# Patient Record
Sex: Female | Born: 1955 | Race: White | Hispanic: No | Smoking: Former smoker
Health system: Southern US, Community
[De-identification: ages and names within clinical notes are randomized; demographics above are authoritative.]

## PROBLEM LIST (undated history)

## (undated) DIAGNOSIS — H469 Unspecified optic neuritis: Secondary | ICD-10-CM

## (undated) DIAGNOSIS — E119 Type 2 diabetes mellitus without complications: Secondary | ICD-10-CM

## (undated) DIAGNOSIS — J45909 Unspecified asthma, uncomplicated: Secondary | ICD-10-CM

## (undated) DIAGNOSIS — F329 Major depressive disorder, single episode, unspecified: Secondary | ICD-10-CM

## (undated) DIAGNOSIS — C439 Malignant melanoma of skin, unspecified: Secondary | ICD-10-CM

## (undated) DIAGNOSIS — E669 Obesity, unspecified: Secondary | ICD-10-CM

## (undated) DIAGNOSIS — E7800 Pure hypercholesterolemia, unspecified: Secondary | ICD-10-CM

## (undated) DIAGNOSIS — F32A Depression, unspecified: Secondary | ICD-10-CM

## (undated) DIAGNOSIS — F419 Anxiety disorder, unspecified: Secondary | ICD-10-CM

## (undated) DIAGNOSIS — I10 Essential (primary) hypertension: Secondary | ICD-10-CM

## (undated) HISTORY — PX: ABDOMINAL HYSTERECTOMY: SHX81

## (undated) HISTORY — PX: JOINT REPLACEMENT: SHX530

---

## 1998-06-19 ENCOUNTER — Ambulatory Visit (HOSPITAL_COMMUNITY): Admission: RE | Admit: 1998-06-19 | Discharge: 1998-06-19 | Payer: Self-pay | Admitting: *Deleted

## 1998-06-19 ENCOUNTER — Encounter: Payer: Self-pay | Admitting: Internal Medicine

## 2008-06-19 ENCOUNTER — Encounter: Admission: RE | Admit: 2008-06-19 | Discharge: 2008-06-19 | Payer: Self-pay | Admitting: Family Medicine

## 2008-07-10 ENCOUNTER — Encounter: Admission: RE | Admit: 2008-07-10 | Discharge: 2008-07-10 | Payer: Self-pay | Admitting: Gastroenterology

## 2008-10-06 ENCOUNTER — Inpatient Hospital Stay (HOSPITAL_COMMUNITY): Admission: RE | Admit: 2008-10-06 | Discharge: 2008-10-07 | Payer: Self-pay | Admitting: Orthopedic Surgery

## 2008-10-28 ENCOUNTER — Inpatient Hospital Stay (HOSPITAL_COMMUNITY): Admission: RE | Admit: 2008-10-28 | Discharge: 2008-10-30 | Payer: Self-pay | Admitting: Orthopedic Surgery

## 2008-11-03 DIAGNOSIS — E119 Type 2 diabetes mellitus without complications: Secondary | ICD-10-CM | POA: Insufficient documentation

## 2008-11-20 ENCOUNTER — Ambulatory Visit (HOSPITAL_COMMUNITY): Admission: RE | Admit: 2008-11-20 | Discharge: 2008-11-20 | Payer: Self-pay | Admitting: Gastroenterology

## 2008-11-20 ENCOUNTER — Encounter (INDEPENDENT_AMBULATORY_CARE_PROVIDER_SITE_OTHER): Payer: Self-pay | Admitting: Gastroenterology

## 2008-11-26 ENCOUNTER — Encounter: Admission: RE | Admit: 2008-11-26 | Discharge: 2008-11-26 | Payer: Self-pay | Admitting: Family Medicine

## 2010-02-01 ENCOUNTER — Encounter: Payer: Self-pay | Admitting: Family Medicine

## 2010-04-15 LAB — BASIC METABOLIC PANEL
BUN: 15 mg/dL (ref 6–23)
BUN: 6 mg/dL (ref 6–23)
CO2: 24 mEq/L (ref 19–32)
CO2: 29 mEq/L (ref 19–32)
Calcium: 9 mg/dL (ref 8.4–10.5)
Chloride: 104 mEq/L (ref 96–112)
Chloride: 106 mEq/L (ref 96–112)
Creatinine, Ser: 0.71 mg/dL (ref 0.4–1.2)
Creatinine, Ser: 0.77 mg/dL (ref 0.4–1.2)
GFR calc Af Amer: >60 mL/min (ref >60)
Glucose, Bld: 117 mg/dL — ABNORMAL HIGH (ref 70–99)
Potassium: 4 mEq/L (ref 3.5–5.1)

## 2010-04-15 LAB — CBC
HCT: 36.1 % (ref 36.0–46.0)
MCHC: 34.4 g/dL (ref 30.0–36.0)
MCHC: 34.6 g/dL (ref 30.0–36.0)
MCV: 91.1 fL (ref 78.0–100.0)
MCV: 92.4 fL (ref 78.0–100.0)
Platelets: 267 10*3/uL (ref 150–400)
Platelets: 316 10*3/uL (ref 150–400)
RBC: 4.42 MIL/uL (ref 3.87–5.11)
RDW: 14 % (ref 11.5–15.5)
WBC: 7.1 10*3/uL (ref 4.0–10.5)
WBC: 7.9 10*3/uL (ref 4.0–10.5)

## 2010-04-15 LAB — DIFFERENTIAL
Basophils Absolute: 0.1 10*3/uL (ref 0.0–0.1)
Basophils Relative: 1 % (ref 0–1)
Eosinophils Absolute: 0.3 10*3/uL (ref 0.0–0.7)
Monocytes Relative: 9 % (ref 3–12)
Neutro Abs: 3.6 10*3/uL (ref 1.7–7.7)
Neutrophils Relative %: 50 % (ref 43–77)

## 2010-04-15 LAB — URINALYSIS, ROUTINE W REFLEX MICROSCOPIC
Protein, ur: NEGATIVE mg/dL
Urobilinogen, UA: 1 mg/dL (ref 0.0–1.0)

## 2010-04-15 LAB — APTT: aPTT: 26 seconds (ref 24–37)

## 2010-04-15 LAB — PROTIME-INR
INR: 1.03 (ref 0.00–1.49)
Prothrombin Time: 13.4 seconds (ref 11.6–15.2)

## 2010-04-16 LAB — URINALYSIS, ROUTINE W REFLEX MICROSCOPIC
Bilirubin Urine: NEGATIVE
Hgb urine dipstick: NEGATIVE
Protein, ur: NEGATIVE mg/dL
Urobilinogen, UA: 0.2 mg/dL (ref 0.0–1.0)

## 2010-04-16 LAB — BASIC METABOLIC PANEL
BUN: 11 mg/dL (ref 6–23)
CO2: 27 mEq/L (ref 19–32)
Calcium: 9.6 mg/dL (ref 8.4–10.5)
Chloride: 105 mEq/L (ref 96–112)
Chloride: 108 mEq/L (ref 96–112)
Creatinine, Ser: 0.8 mg/dL (ref 0.4–1.2)
GFR calc Af Amer: >60 mL/min (ref >60)
GFR calc Af Amer: >60 mL/min (ref >60)
Potassium: 4 mEq/L (ref 3.5–5.1)
Sodium: 140 mEq/L (ref 135–145)

## 2010-04-16 LAB — PROTIME-INR
INR: 1 (ref 0.00–1.49)
Prothrombin Time: 13 seconds (ref 11.6–15.2)

## 2010-04-16 LAB — APTT: aPTT: 25 seconds (ref 24–37)

## 2010-04-16 LAB — DIFFERENTIAL
Basophils Relative: 1 % (ref 0–1)
Eosinophils Absolute: 0.3 10*3/uL (ref 0.0–0.7)
Lymphs Abs: 2.5 10*3/uL (ref 0.7–4.0)
Neutro Abs: 5.2 10*3/uL (ref 1.7–7.7)
Neutrophils Relative %: 60 % (ref 43–77)

## 2010-04-16 LAB — CBC
HCT: 35.7 % — ABNORMAL LOW (ref 36.0–46.0)
Hemoglobin: 12.5 g/dL (ref 12.0–15.0)
MCHC: 35.1 g/dL (ref 30.0–36.0)
MCV: 91.5 fL (ref 78.0–100.0)
MCV: 91.8 fL (ref 78.0–100.0)
Platelets: 267 10*3/uL (ref 150–400)
RBC: 3.89 MIL/uL (ref 3.87–5.11)
WBC: 8.6 10*3/uL (ref 4.0–10.5)

## 2010-10-04 ENCOUNTER — Emergency Department (HOSPITAL_COMMUNITY)
Admission: EM | Admit: 2010-10-04 | Discharge: 2010-10-04 | Disposition: A | Discharge status: Home / Self Care | Payer: BC Managed Care – PPO | Attending: Emergency Medicine | Admitting: Emergency Medicine

## 2010-10-04 DIAGNOSIS — R6883 Chills (without fever): Secondary | ICD-10-CM | POA: Insufficient documentation

## 2010-10-04 DIAGNOSIS — L0211 Cutaneous abscess of neck: Secondary | ICD-10-CM | POA: Insufficient documentation

## 2010-10-18 IMAGING — MG MM SCREEN MAMMOGRAM BILATERAL
6 series · 6 of 6 positions shown · non-contrast
Comparison: none

DG SCREEN MAMMOGRAM BILATERAL
Bilateral CC and MLO view(s) were taken.

DIGITAL SCREENING MAMMOGRAM WITH CAD:
There are scattered fibroglandular densities.  No masses or malignant type calcifications are 
identified.

[R CC]
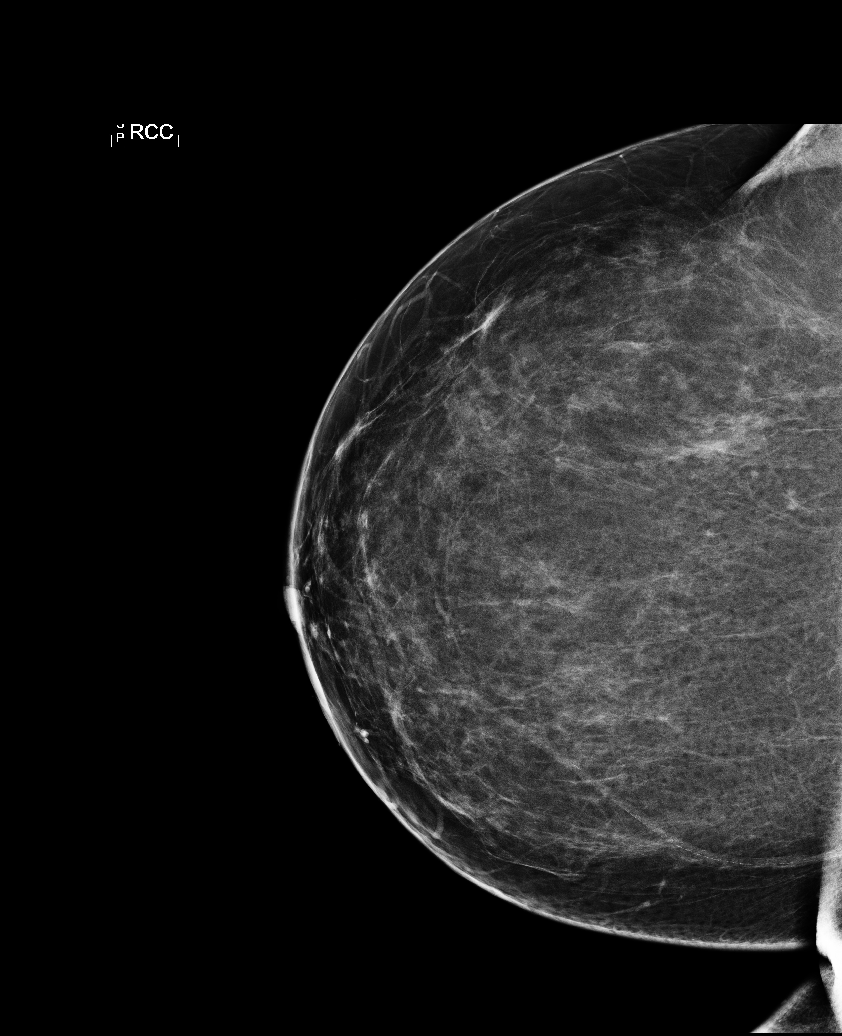

[L CC]
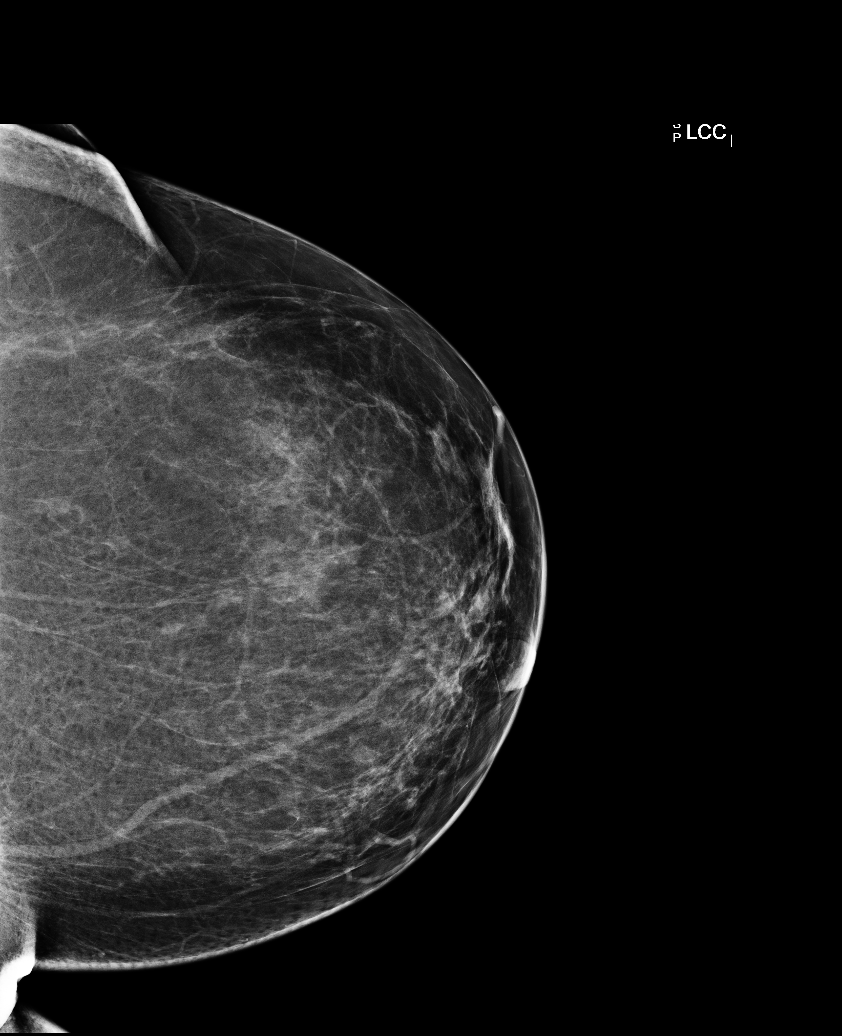

[L MLO]
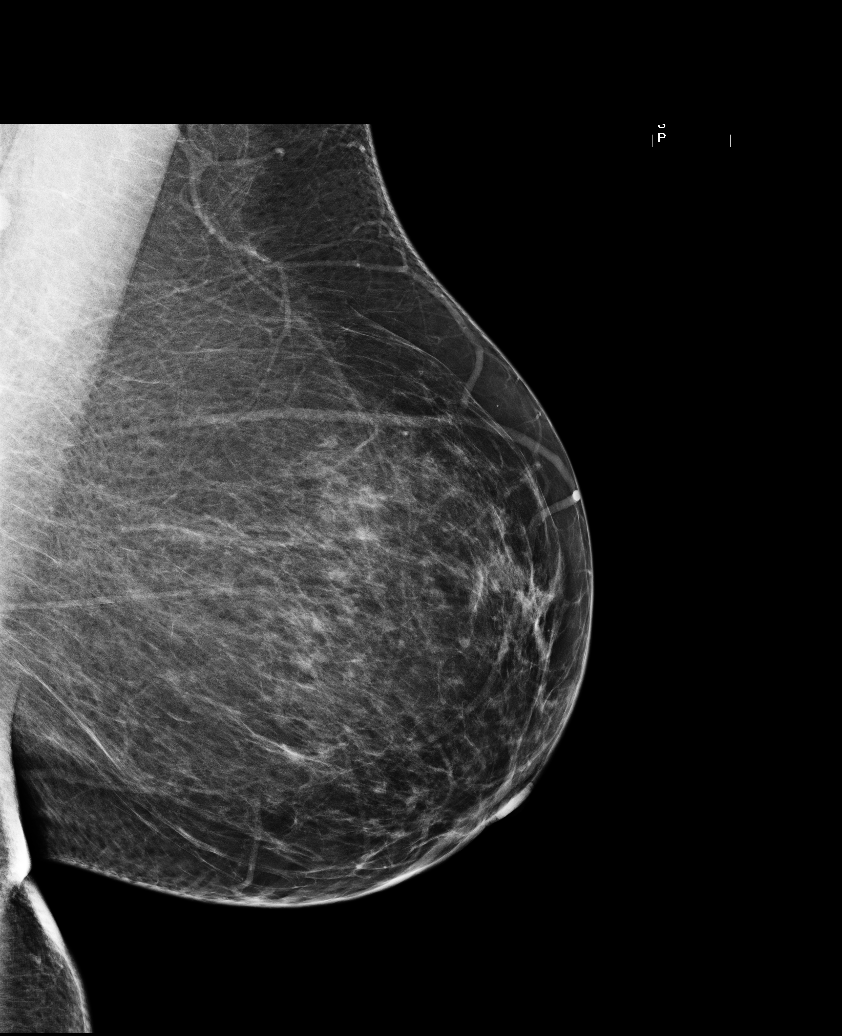

[R MLO]
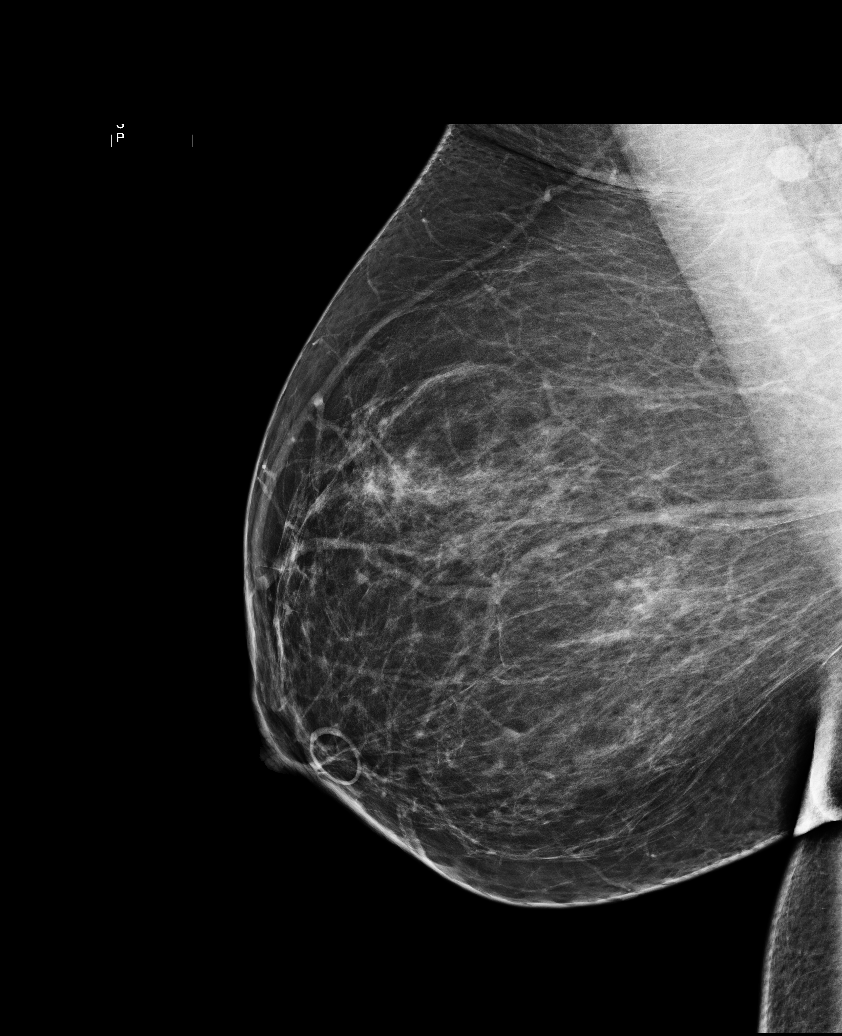

[L XCCL]
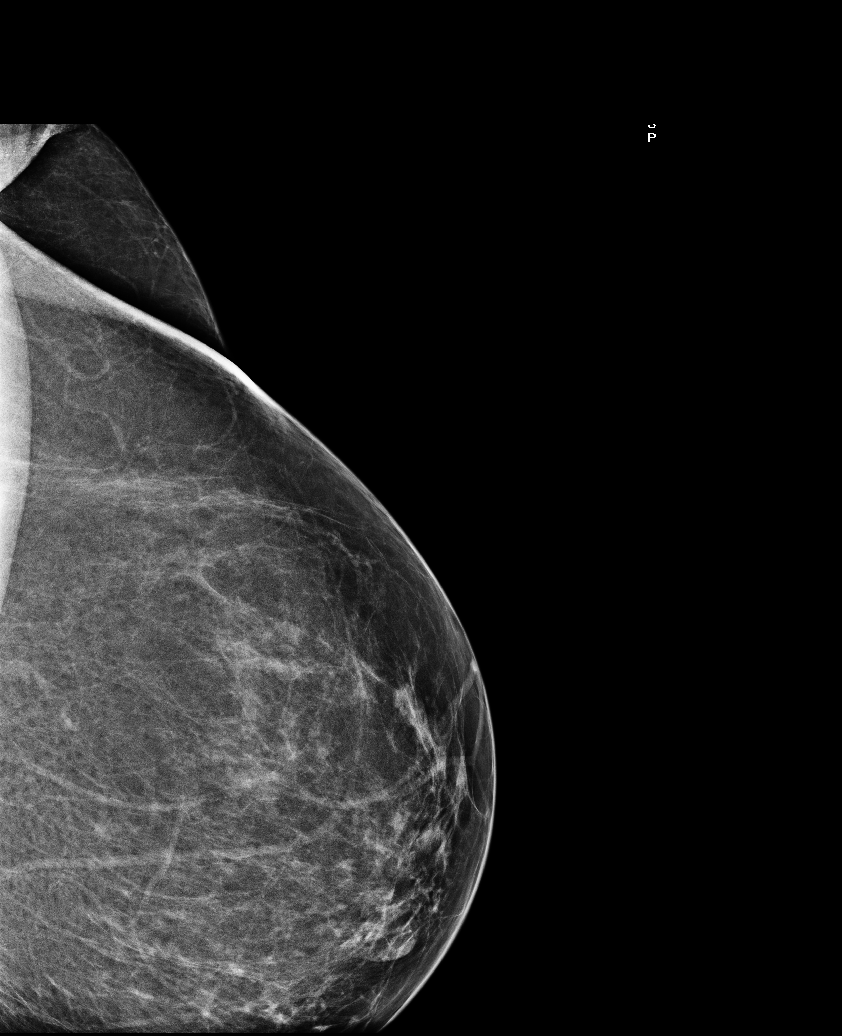

[R XCCL]
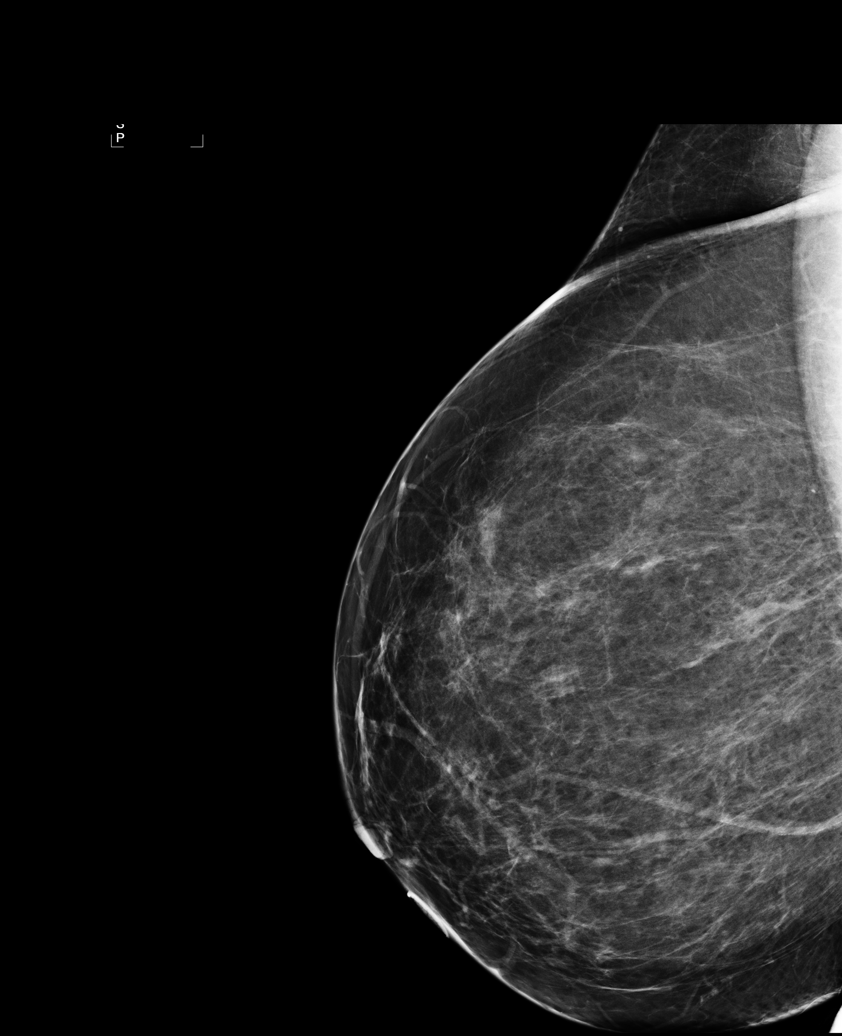

[6 of 6 positions shown; findings below may reference images not displayed]

IMPRESSION: No specific mammographic evidence of malignancy.  Next screening mammogram is recommended in one 
year.

A result letter of this screening mammogram will be mailed directly to the patient.

ASSESSMENT: Negative - BI-RADS 1

Screening mammogram in 1 year.
ANALYZED BY COMPUTER AIDED DETECTION. , THIS PROCEDURE WAS A DIGITAL MAMMOGRAM.

## 2011-04-29 DIAGNOSIS — I10 Essential (primary) hypertension: Secondary | ICD-10-CM | POA: Insufficient documentation

## 2011-04-29 DIAGNOSIS — G4700 Insomnia, unspecified: Secondary | ICD-10-CM | POA: Insufficient documentation

## 2011-04-29 DIAGNOSIS — E1165 Type 2 diabetes mellitus with hyperglycemia: Secondary | ICD-10-CM | POA: Insufficient documentation

## 2011-04-30 DIAGNOSIS — R945 Abnormal results of liver function studies: Secondary | ICD-10-CM | POA: Insufficient documentation

## 2011-12-05 ENCOUNTER — Other Ambulatory Visit: Payer: Self-pay | Admitting: Family Medicine

## 2011-12-05 DIAGNOSIS — Z1231 Encounter for screening mammogram for malignant neoplasm of breast: Secondary | ICD-10-CM

## 2011-12-05 DIAGNOSIS — Z780 Asymptomatic menopausal state: Secondary | ICD-10-CM

## 2011-12-09 ENCOUNTER — Ambulatory Visit
Admission: RE | Admit: 2011-12-09 | Discharge: 2011-12-09 | Disposition: A | Discharge status: Home / Self Care | Payer: BC Managed Care – PPO | Source: Ambulatory Visit | Attending: Family Medicine | Admitting: Family Medicine

## 2011-12-09 DIAGNOSIS — Z780 Asymptomatic menopausal state: Secondary | ICD-10-CM

## 2011-12-09 DIAGNOSIS — Z1231 Encounter for screening mammogram for malignant neoplasm of breast: Secondary | ICD-10-CM

## 2013-03-24 ENCOUNTER — Other Ambulatory Visit: Payer: Self-pay | Admitting: Family Medicine

## 2014-08-16 ENCOUNTER — Encounter (HOSPITAL_COMMUNITY): Payer: Self-pay | Admitting: *Deleted

## 2014-08-16 ENCOUNTER — Inpatient Hospital Stay (HOSPITAL_COMMUNITY)
Admission: EM | Admit: 2014-08-16 | Discharge: 2014-08-18 | DRG: 123 | Disposition: A | Discharge status: Home / Self Care | Payer: BLUE CROSS/BLUE SHIELD | Attending: Family Medicine | Admitting: Family Medicine

## 2014-08-16 ENCOUNTER — Emergency Department (HOSPITAL_COMMUNITY): Payer: BLUE CROSS/BLUE SHIELD

## 2014-08-16 DIAGNOSIS — I10 Essential (primary) hypertension: Secondary | ICD-10-CM | POA: Diagnosis present

## 2014-08-16 DIAGNOSIS — Z820 Family history of epilepsy and other diseases of the nervous system: Secondary | ICD-10-CM | POA: Diagnosis not present

## 2014-08-16 DIAGNOSIS — H469 Unspecified optic neuritis: Principal | ICD-10-CM | POA: Diagnosis present

## 2014-08-16 DIAGNOSIS — E669 Obesity, unspecified: Secondary | ICD-10-CM | POA: Diagnosis present

## 2014-08-16 DIAGNOSIS — E119 Type 2 diabetes mellitus without complications: Secondary | ICD-10-CM

## 2014-08-16 DIAGNOSIS — E785 Hyperlipidemia, unspecified: Secondary | ICD-10-CM

## 2014-08-16 DIAGNOSIS — H5711 Ocular pain, right eye: Secondary | ICD-10-CM | POA: Diagnosis not present

## 2014-08-16 DIAGNOSIS — Z6838 Body mass index (BMI) 38.0-38.9, adult: Secondary | ICD-10-CM | POA: Diagnosis not present

## 2014-08-16 DIAGNOSIS — R9082 White matter disease, unspecified: Secondary | ICD-10-CM | POA: Diagnosis present

## 2014-08-16 DIAGNOSIS — H5710 Ocular pain, unspecified eye: Secondary | ICD-10-CM | POA: Insufficient documentation

## 2014-08-16 DIAGNOSIS — Z87891 Personal history of nicotine dependence: Secondary | ICD-10-CM | POA: Diagnosis not present

## 2014-08-16 HISTORY — DX: Essential (primary) hypertension: I10

## 2014-08-16 HISTORY — DX: Pure hypercholesterolemia, unspecified: E78.00

## 2014-08-16 HISTORY — DX: Type 2 diabetes mellitus without complications: E11.9

## 2014-08-16 HISTORY — DX: Obesity, unspecified: E66.9

## 2014-08-16 LAB — CBC WITH DIFFERENTIAL/PLATELET
BASOS PCT: 0 % (ref 0–1)
Basophils Absolute: 0 10*3/uL (ref 0.0–0.1)
EOS ABS: 0.3 10*3/uL (ref 0.0–0.7)
EOS PCT: 4 % (ref 0–5)
HEMATOCRIT: 38.4 % (ref 36.0–46.0)
Hemoglobin: 13.3 g/dL (ref 12.0–15.0)
LYMPHS ABS: 2.5 10*3/uL (ref 0.7–4.0)
LYMPHS PCT: 31 % (ref 12–46)
MCH: 30.3 pg (ref 26.0–34.0)
MCHC: 34.6 g/dL (ref 30.0–36.0)
MCV: 87.5 fL (ref 78.0–100.0)
Monocytes Absolute: 0.8 10*3/uL (ref 0.1–1.0)
Monocytes Relative: 10 % (ref 3–12)
NEUTROS ABS: 4.4 10*3/uL (ref 1.7–7.7)
Neutrophils Relative %: 55 % (ref 43–77)
Platelets: 261 10*3/uL (ref 150–400)
RBC: 4.39 MIL/uL (ref 3.87–5.11)
RDW: 13.5 % (ref 11.5–15.5)
WBC: 8 10*3/uL (ref 4.0–10.5)

## 2014-08-16 LAB — BASIC METABOLIC PANEL
Anion gap: 11 (ref 5–15)
BUN: 14 mg/dL (ref 6–20)
CALCIUM: 10.1 mg/dL (ref 8.9–10.3)
CHLORIDE: 101 mmol/L (ref 101–111)
CO2: 26 mmol/L (ref 22–32)
Creatinine, Ser: 0.81 mg/dL (ref 0.44–1.00)
GFR calc non Af Amer: >60 mL/min (ref >60)
Glucose, Bld: 142 mg/dL — ABNORMAL HIGH (ref 65–99)
Potassium: 3.6 mmol/L (ref 3.5–5.1)
Sodium: 138 mmol/L (ref 135–145)

## 2014-08-16 LAB — SEDIMENTATION RATE: Sed Rate: 44 mm/hr — ABNORMAL HIGH (ref 0–22)

## 2014-08-16 LAB — C-REACTIVE PROTEIN: CRP: <0.5 mg/dL (ref <1.0)

## 2014-08-16 LAB — GLUCOSE, CAPILLARY: Glucose-Capillary: 204 mg/dL — ABNORMAL HIGH (ref 65–99)

## 2014-08-16 MED ORDER — ONDANSETRON HCL 4 MG PO TABS: 4.0000 mg | ORAL_TABLET | Freq: Four times a day (QID) | ORAL | Status: DC | PRN

## 2014-08-16 MED ORDER — LOSARTAN POTASSIUM-HCTZ 100-25 MG PO TABS: 1.0000 | ORAL_TABLET | Freq: Every day | ORAL | Status: DC

## 2014-08-16 MED ORDER — SODIUM CHLORIDE 0.9 % IV SOLN
1000.0000 mg | Freq: Every day | INTRAVENOUS | Status: AC
  Administered 2014-08-17 – 2014-08-18 (×3): 1000 mg via INTRAVENOUS
  Filled 2014-08-16 (×3): qty 8

## 2014-08-16 MED ORDER — ATORVASTATIN CALCIUM 40 MG PO TABS
40.0000 mg | ORAL_TABLET | Freq: Every day | ORAL | Status: DC
  Administered 2014-08-16 – 2014-08-17 (×2): 40 mg via ORAL
  Filled 2014-08-16 (×2): qty 1

## 2014-08-16 MED ORDER — GADOBENATE DIMEGLUMINE 529 MG/ML IV SOLN: 20.0000 mL | Freq: Once | INTRAVENOUS | Status: DC | PRN

## 2014-08-16 MED ORDER — LORAZEPAM 2 MG/ML IJ SOLN
1.0000 mg | Freq: Once | INTRAMUSCULAR | Status: AC
  Administered 2014-08-16: 1 mg via INTRAVENOUS
  Filled 2014-08-16: qty 1

## 2014-08-16 MED ORDER — VITAMIN D 1000 UNITS PO TABS
2000.0000 [IU] | ORAL_TABLET | Freq: Every day | ORAL | Status: DC
  Administered 2014-08-17 – 2014-08-18 (×2): 2000 [IU] via ORAL
  Filled 2014-08-16 (×2): qty 2

## 2014-08-16 MED ORDER — ONDANSETRON HCL 4 MG/2ML IJ SOLN
4.0000 mg | Freq: Four times a day (QID) | INTRAMUSCULAR | Status: DC | PRN
Start: 2014-08-16 — End: 2014-08-18

## 2014-08-16 MED ORDER — MORPHINE SULFATE 2 MG/ML IJ SOLN: 2.0000 mg | INTRAMUSCULAR | Status: DC | PRN

## 2014-08-16 MED ORDER — FLUTICASONE PROPIONATE 50 MCG/ACT NA SUSP
1.0000 | Freq: Every day | NASAL | Status: DC | PRN
  Filled 2014-08-16: qty 16

## 2014-08-16 MED ORDER — ENOXAPARIN SODIUM 40 MG/0.4ML ~~LOC~~ SOLN
40.0000 mg | SUBCUTANEOUS | Status: DC
  Administered 2014-08-16 – 2014-08-17 (×2): 40 mg via SUBCUTANEOUS
  Filled 2014-08-16 (×2): qty 0.4

## 2014-08-16 MED ORDER — LOSARTAN POTASSIUM 50 MG PO TABS
100.0000 mg | ORAL_TABLET | Freq: Every day | ORAL | Status: DC
  Administered 2014-08-17 – 2014-08-18 (×2): 100 mg via ORAL
  Filled 2014-08-16 (×3): qty 2

## 2014-08-16 MED ORDER — FENTANYL CITRATE (PF) 100 MCG/2ML IJ SOLN
50.0000 ug | Freq: Once | INTRAMUSCULAR | Status: AC
  Administered 2014-08-16: 50 ug via INTRAVENOUS
  Filled 2014-08-16: qty 2

## 2014-08-16 MED ORDER — INSULIN ASPART 100 UNIT/ML ~~LOC~~ SOLN
0.0000 [IU] | Freq: Three times a day (TID) | SUBCUTANEOUS | Status: DC
  Administered 2014-08-17: 15 [IU] via SUBCUTANEOUS
  Administered 2014-08-17: 11 [IU] via SUBCUTANEOUS
  Administered 2014-08-17: 8 [IU] via SUBCUTANEOUS
  Administered 2014-08-18: 11 [IU] via SUBCUTANEOUS
  Administered 2014-08-18: 8 [IU] via SUBCUTANEOUS

## 2014-08-16 MED ORDER — GLIPIZIDE ER 10 MG PO TB24
10.0000 mg | ORAL_TABLET | Freq: Every day | ORAL | Status: DC
  Administered 2014-08-17 – 2014-08-18 (×2): 10 mg via ORAL
  Filled 2014-08-16 (×3): qty 1

## 2014-08-16 MED ORDER — HYDROCHLOROTHIAZIDE 25 MG PO TABS
25.0000 mg | ORAL_TABLET | Freq: Every day | ORAL | Status: DC
  Administered 2014-08-17 – 2014-08-18 (×2): 25 mg via ORAL
  Filled 2014-08-16 (×2): qty 1

## 2014-08-16 MED ORDER — HYDROCODONE-ACETAMINOPHEN 5-325 MG PO TABS
1.0000 | ORAL_TABLET | ORAL | Status: DC | PRN
  Administered 2014-08-16 – 2014-08-18 (×4): 2 via ORAL
  Filled 2014-08-16 (×4): qty 2

## 2014-08-16 NOTE — ED Notes (Signed)
Pt reports symptoms for over a week, having headache and blurred vision to right eye. Progressed to vision loss right eye, has been to eye dr and being treated for occular cellulitis but sent here today due to no relief from symptoms, was told she needs an MRI.

## 2014-08-16 NOTE — Consult Note (Signed)
Reason for Consult:Poor vision right eye Referring Physician: Regenia Skeeter  CC: Decreased vision and pain in the right eye  HPI: Mariah Torres is an 59 y.o. female who reports that her symptoms began about 2 weeks ago.  At that time she noted pain on the right side of her head behind the right eye.  This slowly progressed over the first few days to light sensitivity in the right eye.  She then noted blurry vision and swelling of the eyelid.  The patient was seen by her eye doctor and given antibiotics for a lid cellulitis. Her vision continued to deteriorate.  Her antibiotics were changed with no improvement in symptoms and the patient was referred here for further evaluation.    Past Medical History  Diagnosis Date  . Hypertension   . Diabetes mellitus without complication   . Obesity   . High cholesterol     History reviewed. No pertinent past surgical history.  Family history: Mother and twin sister with MS  Social History:  reports that she does not drink alcohol or use illicit drugs. Her tobacco history is not on file.  Allergies  Allergen Reactions  . Aleve [Naproxen Sodium] Anaphylaxis  . Codeine Itching and Other (See Comments)    jittery  . Ace Inhibitors Cough    Medications: I have reviewed the patient's current medications. Prior to Admission:  Prior to Admission medications   Medication Sig Start Date End Date Taking? Authorizing Provider  amoxicillin-clavulanate (AUGMENTIN) 500-125 MG per tablet Take 1 tablet by mouth every 8 (eight) hours. 14 day course started 08/15/14 08/15/14  Yes Historical Provider, MD  atorvastatin (LIPITOR) 40 MG tablet Take 40 mg by mouth at bedtime. 07/15/14  Yes Historical Provider, MD  cholecalciferol (VITAMIN D) 1000 UNITS tablet Take 2,000 Units by mouth daily.   Yes Historical Provider, MD  fluticasone (FLONASE) 50 MCG/ACT nasal spray Place 1 spray into both nostrils daily as needed for allergies or rhinitis.   Yes Historical Provider, MD   glipiZIDE (GLUCOTROL XL) 10 MG 24 hr tablet Take 10 mg by mouth daily.   Yes Historical Provider, MD  ibuprofen (ADVIL,MOTRIN) 200 MG tablet Take 400 mg by mouth every 6 (six) hours as needed (pain).   Yes Historical Provider, MD  ibuprofen (ADVIL,MOTRIN) 600 MG tablet Take 600 mg by mouth daily as needed (pain).  07/07/14  Yes Historical Provider, MD  losartan-hydrochlorothiazide (HYZAAR) 100-25 MG per tablet Take 1 tablet by mouth daily. 08/09/14  Yes Historical Provider, MD  metFORMIN (GLUCOPHAGE) 1000 MG tablet Take 1,000 mg by mouth 2 (two) times daily with a meal.   Yes Historical Provider, MD  triamcinolone ointment (KENALOG) 0.5 % Apply 1 application topically 2 (two) times daily as needed (rash).  01/24/14 01/24/15 Yes Historical Provider, MD    ROS: History obtained from the patient  General ROS: negative for - chills, fatigue, fever, night sweats, weight gain or weight loss Psychological ROS: negative for - behavioral disorder, hallucinations, memory difficulties, mood swings or suicidal ideation Ophthalmic ROS: as noted in HPI ENT ROS: negative for - epistaxis, nasal discharge, oral lesions, sore throat, tinnitus or vertigo Allergy and Immunology ROS: negative for - hives or itchy/watery eyes Hematological and Lymphatic ROS: negative for - bleeding problems, bruising or swollen lymph nodes Endocrine ROS: negative for - galactorrhea, hair pattern changes, polydipsia/polyuria or temperature intolerance Respiratory ROS: negative for - cough, hemoptysis, shortness of breath or wheezing Cardiovascular ROS: negative for - chest pain, dyspnea on exertion, edema  or irregular heartbeat Gastrointestinal ROS: negative for - abdominal pain, diarrhea, hematemesis, nausea/vomiting or stool incontinence Genito-Urinary ROS: negative for - dysuria, hematuria, incontinence or urinary frequency/urgency Musculoskeletal ROS: negative for - joint swelling or muscular weakness Neurological ROS: as noted in  HPI Dermatological ROS: negative for rash and skin lesion changes  Physical Examination: Blood pressure 127/95, pulse 105, temperature 98.8 F (37.1 C), temperature source Oral, resp. rate 18, height 5' 4" (1.626 m), weight 99.791 kg (220 lb), SpO2 95 %.  HEENT-  Normocephalic, no lesions, without obvious abnormality.  Normal external eye and conjunctiva.  Normal TM's bilaterally.  Normal auditory canals and external ears. Normal external nose, mucus membranes and septum.  Normal pharynx. Cardiovascular- S1, S2 normal, pulses palpable throughout   Lungs- chest clear, no wheezing, rales, normal symmetric air entry Abdomen- soft, non-tender; bowel sounds normal; no masses,  no organomegaly Extremities- no edema Lymph-no adenopathy palpable Musculoskeletal-no joint tenderness, deformity or swelling Skin-warm and dry, no hyperpigmentation, vitiligo, or suspicious lesions  Neurological Examination Mental Status: Alert, oriented, thought content appropriate.  Speech fluent without evidence of aphasia.  Able to follow 3 step commands without difficulty. Cranial Nerves: II: Mild disc swelling on the right; Decreased vision from the right eye, right APD III,IV, VI: mild right ptosis, extra-ocular motions intact bilaterally V,VII: smile symmetric, facial light touch sensation normal bilaterally VIII: hearing normal bilaterally IX,X: gag reflex present XI: bilateral shoulder shrug XII: midline tongue extension Motor: Right : Upper extremity   5/5    Left:     Upper extremity   5/5  Lower extremity   5/5     Lower extremity   5/5 Tone and bulk:normal tone throughout; no atrophy noted Sensory: Pinprick and light touch intact throughout, bilaterally Deep Tendon Reflexes: 2+ in the upper extremities and absent in the lower extremities Plantars: Right: mute   Left: mute Cerebellar: normal finger-to-nose and normal heel-to-shin testing bilaterally Gait: normal gait and station   Laboratory  Studies:   Basic Metabolic Panel:  Recent Labs Lab 08/16/14 1405  NA 138  K 3.6  CL 101  CO2 26  GLUCOSE 142*  BUN 14  CREATININE 0.81  CALCIUM 10.1    Liver Function Tests: No results for input(s): AST, ALT, ALKPHOS, BILITOT, PROT, ALBUMIN in the last 168 hours. No results for input(s): LIPASE, AMYLASE in the last 168 hours. No results for input(s): AMMONIA in the last 168 hours.  CBC:  Recent Labs Lab 08/16/14 1405  WBC 8.0  NEUTROABS 4.4  HGB 13.3  HCT 38.4  MCV 87.5  PLT 261    Cardiac Enzymes: No results for input(s): CKTOTAL, CKMB, CKMBINDEX, TROPONINI in the last 168 hours.  BNP: Invalid input(s): POCBNP  CBG: No results for input(s): GLUCAP in the last 168 hours.  Microbiology: Results for orders placed or performed during the hospital encounter of 11/20/08  Clotest (H. pylori), biopsy     Status: Abnormal   Collection Time: 11/20/08  7:38 AM  Result Value Ref Range Status   Helicobacter screen (A) UREASE NEGATIVE Final    UREASE POSITIVE        CLOTEST DETECTS THE UREASE ENZYME OF HELICOBACTER PYLORI IN GASTRIC MUCOSAL BIOPSIES.    Coagulation Studies: No results for input(s): LABPROT, INR in the last 72 hours.  Urinalysis: No results for input(s): COLORURINE, LABSPEC, PHURINE, GLUCOSEU, HGBUR, BILIRUBINUR, KETONESUR, PROTEINUR, UROBILINOGEN, NITRITE, LEUKOCYTESUR in the last 168 hours.  Invalid input(s): APPERANCEUR  Lipid Panel:  No results found for: CHOL,  TRIG, HDL, CHOLHDL, VLDL, LDLCALC  HgbA1C: No results found for: HGBA1C  Urine Drug Screen:  No results found for: LABOPIA, COCAINSCRNUR, LABBENZ, AMPHETMU, THCU, LABBARB  Alcohol Level: No results for input(s): ETH in the last 168 hours.   Imaging: Mr Jeri Cos BT Contrast  08/16/2014   CLINICAL DATA:  Headache and blurred vision over the RIGHT eye. RIGHT eye visual loss. Diabetes and hypertension.  EXAM: MRI HEAD AND ORBITS WITHOUT AND WITH CONTRAST  TECHNIQUE: Multiplanar,  multiecho pulse sequences of the brain and surrounding structures were obtained without and with intravenous contrast. Multiplanar, multiecho pulse sequences of the orbits and surrounding structures were obtained including fat saturation techniques, before and after intravenous contrast administration.  CONTRAST:  MultiHance 20 mL.  COMPARISON:  None.  FINDINGS: MRI HEAD FINDINGS  Moderately severe motion degradation limits diagnostic accuracy.  No acute stroke or hemorrhage. No mass lesion or hydrocephalus. No extra-axial fluid. Mild atrophy. Mild subcortical and periventricular T2 and FLAIR hyperintensities, likely chronic microvascular ischemic change. No restricted diffusion or postcontrast enhancement to suggest acute demyelination. The pattern of white matter disease is not typical for a demyelinating process.  Intracranial Flow voids are maintained. No chronic hemorrhage. No midline abnormality. Calvarium intact. No sinus or mastoid fluid. Post infusion, no abnormal intracranial enhancement. Major dural venous sinuses are patent.  MRI ORBITS FINDINGS  Moderately severe motion degradation limits diagnostic accuracy.  The RIGHT optic nerve is abnormal. Definitive characterization is made difficult due to patient motion, but it appears enlarged relative to the LEFT. The nerve is T2 hyperintense throughout its course. Mild diffusion restriction is suspected on axial imaging. The RIGHT optic papilla is involved, swollen, and projects into the posterior globe. No intracranial involvement. Normal chiasm. Normal LEFT optic nerve.  No orbital inflammation aside from the optic neuritis. Orbital musculature is normal size. Normal lacrimal glands. Otherwise normal globes and periorbital soft tissues.  IMPRESSION: Motion degraded exam.  See discussion above.  Isolated acute optic neuritis, with a swollen, hyperintense, enhancing intraorbital optic nerve and papilla. No evidence for pseudotumor of the orbit, thyroid  ophthalmopathy, or orbital cellulitis.  Unremarkable appearing intracranial compartment. Mild atrophy. White matter disease not typical of demyelination, most consistent with chronic microvascular ischemic change.  Findings discussed with the EDP.   Electronically Signed   By: Staci Righter M.D.   On: 08/16/2014 18:24   [name] Wo/w Cm  08/16/2014   CLINICAL DATA:  Headache and blurred vision over the RIGHT eye. RIGHT eye visual loss. Diabetes and hypertension.  EXAM: MRI HEAD AND ORBITS WITHOUT AND WITH CONTRAST  TECHNIQUE: Multiplanar, multiecho pulse sequences of the brain and surrounding structures were obtained without and with intravenous contrast. Multiplanar, multiecho pulse sequences of the orbits and surrounding structures were obtained including fat saturation techniques, before and after intravenous contrast administration.  CONTRAST:  MultiHance 20 mL.  COMPARISON:  None.  FINDINGS: MRI HEAD FINDINGS  Moderately severe motion degradation limits diagnostic accuracy.  No acute stroke or hemorrhage. No mass lesion or hydrocephalus. No extra-axial fluid. Mild atrophy. Mild subcortical and periventricular T2 and FLAIR hyperintensities, likely chronic microvascular ischemic change. No restricted diffusion or postcontrast enhancement to suggest acute demyelination. The pattern of white matter disease is not typical for a demyelinating process.  Intracranial Flow voids are maintained. No chronic hemorrhage. No midline abnormality. Calvarium intact. No sinus or mastoid fluid. Post infusion, no abnormal intracranial enhancement. Major dural venous sinuses are patent.  MRI ORBITS FINDINGS  Moderately severe motion degradation  limits diagnostic accuracy.  The RIGHT optic nerve is abnormal. Definitive characterization is made difficult due to patient motion, but it appears enlarged relative to the LEFT. The nerve is T2 hyperintense throughout its course. Mild diffusion restriction is suspected on axial imaging.  The RIGHT optic papilla is involved, swollen, and projects into the posterior globe. No intracranial involvement. Normal chiasm. Normal LEFT optic nerve.  No orbital inflammation aside from the optic neuritis. Orbital musculature is normal size. Normal lacrimal glands. Otherwise normal globes and periorbital soft tissues.  IMPRESSION: Motion degraded exam.  See discussion above.  Isolated acute optic neuritis, with a swollen, hyperintense, enhancing intraorbital optic nerve and papilla. No evidence for pseudotumor of the orbit, thyroid ophthalmopathy, or orbital cellulitis.  Unremarkable appearing intracranial compartment. Mild atrophy. White matter disease not typical of demyelination, most consistent with chronic microvascular ischemic change.  Findings discussed with the EDP.   Electronically Signed   By: Staci Righter M.D.   On: 08/16/2014 18:24     Assessment/Plan: 59 year old female with a family history of MS who presents with signs and symptoms consistent with optic neuritis.  MRI of the brain independently reviewed and shows right optic nerve enhancement but no evidence of MS lesions otherwise.  With family history of MS though, steroid treatment is indicated.    Recommendations: 1.  ESR, CRP 2.  Solumedrol 1014m IV X 3 days 3.  Patient will need neurology follow up at discharge.    LAlexis Goodell MD [facility] [phone]/06/2014, 8:26 PM

## 2014-08-16 NOTE — ED Notes (Signed)
Report called to 6n rn

## 2014-08-16 NOTE — ED Provider Notes (Signed)
Patient's MRI shows optic neuritis without other signs of multiple sclerosis. Patient will need to be admitted for IV steroid. Consult neurology and admit to the hospitalist. Neuro would like to wait for their consult prior to starting steroids as dose will depend on her evaluation  Sherwood Gambler, MD 08/17/14 1050

## 2014-08-16 NOTE — ED Provider Notes (Signed)
CSN: [id number]     Arrival date & time 08/16/14  1243 History   First MD Initiated Contact with Patient 08/16/14 1321     Chief Complaint  Patient presents with  . Eye Problem  . Loss of Vision     Patient is a 59 y.o. female presenting with eye problem. The history is provided by the patient. No language interpreter was used.  Eye Problem  Ms. Bolda presents for evaluation of eye pain and vision loss.  She reports one week of right sided periorbital headache and decreased vision (sees a small amt of color and shadows).  She initially had some swelling around the right eye, which has since resolved.  She was started on doxycyline by her eye doctor and switched to augmentin yesterday.  Sxs are severe, constant, worsening.    Past Medical History  Diagnosis Date  . Hypertension   . Diabetes mellitus without complication   . Obesity   . High cholesterol    History reviewed. No pertinent past surgical history. History reviewed. No pertinent family history. History  Substance Use Topics  . Smoking status: Not on file  . Smokeless tobacco: Not on file  . Alcohol Use: No   OB History    No data available     Review of Systems  All other systems reviewed and are negative.     Allergies  Aleve; Codeine; and Ace inhibitors  Home Medications   Prior to Admission medications   Medication Sig Start Date End Date Taking? Authorizing Provider  amoxicillin-clavulanate (AUGMENTIN) 500-125 MG per tablet Take 1 tablet by mouth every 8 (eight) hours. 14 day course started 08/15/14 08/15/14  Yes Historical Provider, MD  atorvastatin (LIPITOR) 40 MG tablet Take 40 mg by mouth at bedtime. 07/15/14  Yes Historical Provider, MD  cholecalciferol (VITAMIN D) 1000 UNITS tablet Take 2,000 Units by mouth daily.   Yes Historical Provider, MD  fluticasone (FLONASE) 50 MCG/ACT nasal spray Place 1 spray into both nostrils daily as needed for allergies or rhinitis.   Yes Historical Provider, MD  glipiZIDE  (GLUCOTROL XL) 10 MG 24 hr tablet Take 10 mg by mouth daily.   Yes Historical Provider, MD  ibuprofen (ADVIL,MOTRIN) 200 MG tablet Take 400 mg by mouth every 6 (six) hours as needed (pain).   Yes Historical Provider, MD  ibuprofen (ADVIL,MOTRIN) 600 MG tablet Take 600 mg by mouth daily as needed (pain).  07/07/14  Yes Historical Provider, MD  losartan-hydrochlorothiazide (HYZAAR) 100-25 MG per tablet Take 1 tablet by mouth daily. 08/09/14  Yes Historical Provider, MD  metFORMIN (GLUCOPHAGE) 1000 MG tablet Take 1,000 mg by mouth 2 (two) times daily with a meal.   Yes Historical Provider, MD  triamcinolone ointment (KENALOG) 0.5 % Apply 1 application topically 2 (two) times daily as needed (rash).  01/24/14 01/24/15 Yes Historical Provider, MD   BP 120/83 mmHg  Pulse 90  Temp(Src) 98.8 F (37.1 C) (Oral)  Resp 18  Ht 5\' 4"  (1.626 m)  Wt 220 lb (99.791 kg)  BMI 37.74 kg/m2  SpO2 94% Physical Exam  Constitutional: She is oriented to person, place, and time. She appears well-developed and well-nourished.  HENT:  Head: Normocephalic and atraumatic.  Mouth/Throat: Oropharynx is clear and moist.  No facial tenderness to palpation.    Eyes: EOM are normal. Pupils are equal, round, and reactive to light. Right eye exhibits no discharge. Left eye exhibits no discharge.  Cardiovascular: Normal rate and regular rhythm.   No murmur  heard. Pulmonary/Chest: Effort normal and breath sounds normal. No respiratory distress.  Abdominal: Soft. There is no tenderness. There is no rebound and no guarding.  Musculoskeletal: She exhibits no edema or tenderness.  Neurological: She is alert and oriented to person, place, and time. No cranial nerve deficit.  Skin: Skin is warm and dry.  Psychiatric: She has a normal mood and affect. Her behavior is normal.  Nursing note and vitals reviewed.   ED Course  Procedures (including critical care time) Labs Review Labs Reviewed  BASIC METABOLIC PANEL - Abnormal;  Notable for the following:    Glucose, Bld 142 (*)    All other components within normal limits  SEDIMENTATION RATE - Abnormal; Notable for the following:    Sed Rate 44 (*)    All other components within normal limits  CBC WITH DIFFERENTIAL/PLATELET    Imaging Review No results found.   EKG Interpretation None      MDM   Final diagnoses:  Eye pain, right    Pt here for one week of right periorbital pain, recently on abx for possible cellulitis.  No current evidence of infection on exam. D/w pt's optometrist - pt with normal dilated exam yesterday.  D/w on call Opthalmologist - recommend MRI orbit/brain to rule out optic neuritis.  Pt care transferred pending MRI.      Quintella Reichert, MD 08/16/14 [phone]

## 2014-08-16 NOTE — ED Notes (Signed)
Alert no distress 

## 2014-08-16 NOTE — H&P (Signed)
[facility] Admission History and Physical Service Pager: [phone]  Patient name: Mariah Torres Medical record number: [id number] Date of birth: January 14, 1955 Age: 59 y.o. Gender: female  Primary Care Provider: No primary care provider on file. Consultants: Neuro, Optho Code Status: full code per discussion on admission  Chief Complaint: Decreased vision and pain in R eye  Assessment and Plan: LAURISA SAHAKIAN is a 59 y.o. female presenting with R optic neuritis. PMH is significant for HTN, HLD, T2DM.  Right Optic Neuritis: MRI with isolated optic neuritis without white matter demyelination.  Concern for MS given strong family history, but age is unusual for initial presentation of MS. - Admit to [place], med-surg, attending [facility] - Neurology consulted, appreciate recs - Consult to Optho in AM - CRP ordered - Solumedrol 1052m IV daily x3 days - Needs Neuro f/u as OP - Pain control with Norco q4h prn for moderate pain and morphine 220mq4h prn for severe pain  HTN: Normotensive on admission. May become elevated with high dose steroids - Continue home losartan-HCTZ 100-2562maily  T2DM: Last A1c 6.5 in 01/2014. Expect elevation in CBGs on high dose steroids as above. - Repeat A1c in AM - Hold home Metformin - Continue home glipizide - CBG monitoring and Moderate SSI  HLD: Last lipid panel 01/2014 with LDL 108, HDL 55 - Continue home Atorvastatin 35m26mily - Consider daily aspirin therapy for primary prevention  FEN/GI: SLIV, Carb mod diet Prophylaxis: Lovenox  Disposition: admit to [facility], med-surg. D/c pending completion of course of high dose steroids  History of Present Illness: Mariah POTEETa 59 y28. female presenting with R eye pain and blurry vision.  She reports that symptoms of R eye pain, HAs, blurred vision, worsening color vision, and eyelid swelling started about 1.5-2 weeks ago.  She was treated with antibiotics for ocular cellulitis,  and switched to a second antibiotics when symptoms continued to progress.  She called her ophthalmologist today and reported that symptoms persisted, and he referred her to ED for evaluation of possible optic neuritis.  She denies any fevers, redness around eye, numbness/weakness of extremities, or unsteady gait.  She reports that her mother (diagnosed around age 3) 70d identical twin sister (diagnosed in her 30s)85sth have MS.  In the ED: Ativan 1mg 65mfor MRI sedation, fentanyl 50mcg54mview Of Systems: Per HPI with the following additions: none, as above Otherwise 12 point review of systems was performed and was unremarkable.  Patient Active Problem List   Diagnosis Date Noted  . Optic neuritis 08/16/2014   Past Medical History: Past Medical History  Diagnosis Date  . Hypertension   . Diabetes mellitus without complication   . Obesity   . High cholesterol    Past Surgical History: History reviewed. No pertinent past surgical history. Social History: History  Substance Use Topics  . Smoking status: Not on file  . Smokeless tobacco: Not on file  . Alcohol Use: No   Additional social history: No ETOH/illicit drugs.  Smoked casually for ~5 years >20 years ago.  Please also refer to relevant sections of EMR.  Family History: History reviewed. No pertinent family history. Allergies and Medications: Allergies  Allergen Reactions  . Aleve [Naproxen Sodium] Anaphylaxis  . Codeine Itching and Other (See Comments)    jittery  . Ace Inhibitors Cough   No current facility-administered medications on file prior to encounter.   No current outpatient prescriptions on file prior to encounter.  Objective: BP 127/95 mmHg  Pulse 105  Temp(Src) 98.8 F (37.1 C) (Oral)  Resp 18  Ht _0  (1.626 m)  Wt 220 lb (99.791 kg)  BMI 37.74 kg/m2  SpO2 95% Exam: General: NAD, sitting comfortably in bed, R eye closed HEENT: NCAT, MMM, PERRL, EOMI, sclera clear, OP  clear Cardiovascular: RRR, no m/r/g. Intact distal pulses Respiratory: CTAB, no w/r/c. Normal WOB Abdomen: Soft, NTND, +BS. No rebound/guarding, no masses Extremities: WWP, no edema Skin: no rashes, warm/dry/intact Neuro: AAOx3, speech fluent, CN 3-12 intact, strength intact, sensation intact to light touch, FNF intact Psych: Normal mood and affect  Labs and Imaging: CBC BMET   Recent Labs Lab 08/16/14 1405  WBC 8.0  HGB 13.3  HCT 38.4  PLT 261    Recent Labs Lab 08/16/14 1405  NA 138  K 3.6  CL 101  CO2 26  BUN 14  CREATININE 0.81  GLUCOSE 142*  CALCIUM 10.1     ESR 44  Mr Brain W Wo Contrast  08/16/2014   CLINICAL DATA:  Headache and blurred vision over the RIGHT eye. RIGHT eye visual loss. Diabetes and hypertension.  EXAM: MRI HEAD AND ORBITS WITHOUT AND WITH CONTRAST  TECHNIQUE: Multiplanar, multiecho pulse sequences of the brain and surrounding structures were obtained without and with intravenous contrast. Multiplanar, multiecho pulse sequences of the orbits and surrounding structures were obtained including fat saturation techniques, before and after intravenous contrast administration.  CONTRAST:  MultiHance 20 mL.  COMPARISON:  None.  FINDINGS: MRI HEAD FINDINGS  Moderately severe motion degradation limits diagnostic accuracy.  No acute stroke or hemorrhage. No mass lesion or hydrocephalus. No extra-axial fluid. Mild atrophy. Mild subcortical and periventricular T2 and FLAIR hyperintensities, likely chronic microvascular ischemic change. No restricted diffusion or postcontrast enhancement to suggest acute demyelination. The pattern of white matter disease is not typical for a demyelinating process.  Intracranial Flow voids are maintained. No chronic hemorrhage. No midline abnormality. Calvarium intact. No sinus or mastoid fluid. Post infusion, no abnormal intracranial enhancement. Major dural venous sinuses are patent.  MRI ORBITS FINDINGS  Moderately severe motion  degradation limits diagnostic accuracy.  The RIGHT optic nerve is abnormal. Definitive characterization is made difficult due to patient motion, but it appears enlarged relative to the LEFT. The nerve is T2 hyperintense throughout its course. Mild diffusion restriction is suspected on axial imaging. The RIGHT optic papilla is involved, swollen, and projects into the posterior globe. No intracranial involvement. Normal chiasm. Normal LEFT optic nerve.  No orbital inflammation aside from the optic neuritis. Orbital musculature is normal size. Normal lacrimal glands. Otherwise normal globes and periorbital soft tissues.  IMPRESSION: Motion degraded exam.  See discussion above.  Isolated acute optic neuritis, with a swollen, hyperintense, enhancing intraorbital optic nerve and papilla. No evidence for pseudotumor of the orbit, thyroid ophthalmopathy, or orbital cellulitis.  Unremarkable appearing intracranial compartment. Mild atrophy. White matter disease not typical of demyelination, most consistent with chronic microvascular ischemic change.  Findings discussed with the EDP.   Electronically Signed   By: Staci Righter M.D.   On: 08/16/2014 18:24   [name] Wo/w Cm  08/16/2014   CLINICAL DATA:  Headache and blurred vision over the RIGHT eye. RIGHT eye visual loss. Diabetes and hypertension.  EXAM: MRI HEAD AND ORBITS WITHOUT AND WITH CONTRAST  TECHNIQUE: Multiplanar, multiecho pulse sequences of the brain and surrounding structures were obtained without and with intravenous contrast. Multiplanar, multiecho pulse sequences of the orbits and surrounding structures were  obtained including fat saturation techniques, before and after intravenous contrast administration.  CONTRAST:  MultiHance 20 mL.  COMPARISON:  None.  FINDINGS: MRI HEAD FINDINGS  Moderately severe motion degradation limits diagnostic accuracy.  No acute stroke or hemorrhage. No mass lesion or hydrocephalus. No extra-axial fluid. Mild atrophy. Mild  subcortical and periventricular T2 and FLAIR hyperintensities, likely chronic microvascular ischemic change. No restricted diffusion or postcontrast enhancement to suggest acute demyelination. The pattern of white matter disease is not typical for a demyelinating process.  Intracranial Flow voids are maintained. No chronic hemorrhage. No midline abnormality. Calvarium intact. No sinus or mastoid fluid. Post infusion, no abnormal intracranial enhancement. Major dural venous sinuses are patent.  MRI ORBITS FINDINGS  Moderately severe motion degradation limits diagnostic accuracy.  The RIGHT optic nerve is abnormal. Definitive characterization is made difficult due to patient motion, but it appears enlarged relative to the LEFT. The nerve is T2 hyperintense throughout its course. Mild diffusion restriction is suspected on axial imaging. The RIGHT optic papilla is involved, swollen, and projects into the posterior globe. No intracranial involvement. Normal chiasm. Normal LEFT optic nerve.  No orbital inflammation aside from the optic neuritis. Orbital musculature is normal size. Normal lacrimal glands. Otherwise normal globes and periorbital soft tissues.  IMPRESSION: Motion degraded exam.  See discussion above.  Isolated acute optic neuritis, with a swollen, hyperintense, enhancing intraorbital optic nerve and papilla. No evidence for pseudotumor of the orbit, thyroid ophthalmopathy, or orbital cellulitis.  Unremarkable appearing intracranial compartment. Mild atrophy. White matter disease not typical of demyelination, most consistent with chronic microvascular ischemic change.  Findings discussed with the EDP.   Electronically Signed   By: Staci Righter M.D.   On: 08/16/2014 18:24     Virginia Crews, MD 08/16/2014, 8:15 PM PGY-2, [place] Intern pager: [phone], text pages welcome

## 2014-08-16 NOTE — ED Notes (Signed)
Transported to MRI

## 2014-08-16 NOTE — Progress Notes (Signed)
New admit came fr ED.

## 2014-08-17 LAB — GLUCOSE, CAPILLARY
GLUCOSE-CAPILLARY: 369 mg/dL — AB (ref 65–99)
Glucose-Capillary: 292 mg/dL — ABNORMAL HIGH (ref 65–99)
Glucose-Capillary: 377 mg/dL — ABNORMAL HIGH (ref 65–99)
Glucose-Capillary: 326 mg/dL — ABNORMAL HIGH (ref 65–99)

## 2014-08-17 LAB — CBC
HCT: 40.5 % (ref 36.0–46.0)
Hemoglobin: 13.8 g/dL (ref 12.0–15.0)
MCH: 29.9 pg (ref 26.0–34.0)
MCHC: 34.1 g/dL (ref 30.0–36.0)
MCV: 87.9 fL (ref 78.0–100.0)
PLATELETS: 288 10*3/uL (ref 150–400)
RBC: 4.61 MIL/uL (ref 3.87–5.11)
RDW: 13.7 % (ref 11.5–15.5)
WBC: 7.8 10*3/uL (ref 4.0–10.5)

## 2014-08-17 LAB — BASIC METABOLIC PANEL
ANION GAP: 10 (ref 5–15)
BUN: 15 mg/dL (ref 6–20)
CO2: 26 mmol/L (ref 22–32)
CREATININE: 0.93 mg/dL (ref 0.44–1.00)
Calcium: 9.7 mg/dL (ref 8.9–10.3)
Chloride: 99 mmol/L — ABNORMAL LOW (ref 101–111)
GFR calc non Af Amer: >60 mL/min (ref >60)
Glucose, Bld: 299 mg/dL — ABNORMAL HIGH (ref 65–99)
Potassium: 4.2 mmol/L (ref 3.5–5.1)
Sodium: 135 mmol/L (ref 135–145)

## 2014-08-17 NOTE — Progress Notes (Signed)
Family Medicine Teaching Service Daily Progress Note Intern Pager: [phone]  Patient name: Mariah Torres Medical record number: [id number] Date of birth: Jun 10, 1955 Age: 59 y.o. Gender: female  Primary Care Provider: No primary care provider on file. Consultants: Neuro, Optho Code Status: full code  Pt Overview and Major Events to Date:  8/6 - admit for optic neuritis, neuro consulted, started high dose IV steroids  Assessment and Plan: Mariah Torres is a 59 y.o. female presenting with R optic neuritis. PMH is significant for HTN, HLD, T2DM.  Right Optic Neuritis: MRI with isolated optic neuritis without white matter demyelination. Concern for MS given strong family history. - Neurology consulted, appreciate recs - Optho consulted - needs visual fields testing at discharge (have her go to Dr. Ellie Lunch soon after discharge) - Solumedrol 1016m IV daily x3 days (D2) - Needs Neuro f/u as OP - Pain control with Norco q4h prn (1 dose in last 24h) - will d/c Morphine   HTN: Normotensive  - Continue home losartan-HCTZ 100-222mdaily - Continue to monitor in setting of high dose steroids  T2DM: CBGs elevated to 299 this AM in setting of high dose steroids - Repeat A1c pending - Hold home Metformin - Continue home glipizide - CBG monitoring and Moderate SSI - may need to increase to resistant SSI if CBGs continue to rise  HLD: Last lipid panel 01/2014 with LDL 108, HDL 55 - Continue home Atorvastatin 4067maily - Consider daily aspirin therapy for primary prevention  FEN/GI: SLIV, Carb mod diet Prophylaxis: Lovenox  Disposition: med-surg status, d/c pending clinical improvement and completion of 3d course of high dose steroids  Subjective:  Feeling well.  Vision is about the same.  No other complaints  Objective: Temp:  [97.7 F (36.5 C)-98.8 F (37.1 C)] 97.7 F (36.5 C) (08/07 0543) Pulse Rate:  [90-115] 100 (08/07 0543) Resp:  [18-20] 18 (08/07 0543) BP:  (120-152)/(79-106) 124/86 mmHg (08/07 0543) SpO2:  [93 %-97 %] 94 % (08/07 0543) Weight:  [220 lb (99.791 kg)-230 lb (104.327 kg)] 230 lb (104.327 kg) (08/06 2138) Physical Exam: General: NAD, sitting comfortably in bed HEENT: NCAT, MMM, PERRL, EOMI, sclera clear Cardiovascular: RRR, no m/r/g. Intact distal pulses Respiratory: CTAB, no w/r/c. Normal WOB Extremities: WWP, no edema Neuro: AAOx3, speech fluent, CN 3-12 intact, strength intact, sensation intact to light touch, FNF intact  Laboratory:  Recent Labs Lab 08/16/14 1405 08/17/14 0610  WBC 8.0 7.8  HGB 13.3 13.8  HCT 38.4 40.5  PLT 261 288    Recent Labs Lab 08/16/14 1405 08/17/14 0610  NA 138 135  K 3.6 4.2  CL 101 99*  CO2 26 26  BUN 14 15  CREATININE 0.81 0.93  CALCIUM 10.1 9.7  GLUCOSE 142* 299*    ESR 44, CRP <0.5  Imaging/Diagnostic Tests: Mariah Torres  08/16/2014   CLINICAL DATA:  Headache and blurred vision over the RIGHT eye. RIGHT eye visual loss. Diabetes and hypertension.  EXAM: MRI HEAD AND ORBITS WITHOUT AND WITH CONTRAST  TECHNIQUE: Multiplanar, multiecho pulse sequences of the brain and surrounding structures were obtained without and with intravenous contrast. Multiplanar, multiecho pulse sequences of the orbits and surrounding structures were obtained including fat saturation techniques, before and after intravenous contrast administration.  CONTRAST:  MultiHance 20 mL.  COMPARISON:  None.  FINDINGS: MRI HEAD FINDINGS  Moderately severe motion degradation limits diagnostic accuracy.  No acute stroke or hemorrhage. No mass lesion or hydrocephalus. No extra-axial fluid. Mild  atrophy. Mild subcortical and periventricular T2 and FLAIR hyperintensities, likely chronic microvascular ischemic change. No restricted diffusion or postcontrast enhancement to suggest acute demyelination. The pattern of white matter disease is not typical for a demyelinating process.  Intracranial Flow voids are  maintained. No chronic hemorrhage. No midline abnormality. Calvarium intact. No sinus or mastoid fluid. Post infusion, no abnormal intracranial enhancement. Major dural venous sinuses are patent.  MRI ORBITS FINDINGS  Moderately severe motion degradation limits diagnostic accuracy.  The RIGHT optic nerve is abnormal. Definitive characterization is made difficult due to patient motion, but it appears enlarged relative to the LEFT. The nerve is T2 hyperintense throughout its course. Mild diffusion restriction is suspected on axial imaging. The RIGHT optic papilla is involved, swollen, and projects into the posterior globe. No intracranial involvement. Normal chiasm. Normal LEFT optic nerve.  No orbital inflammation aside from the optic neuritis. Orbital musculature is normal size. Normal lacrimal glands. Otherwise normal globes and periorbital soft tissues.  IMPRESSION: Motion degraded exam.  See discussion above.  Isolated acute optic neuritis, with a swollen, hyperintense, enhancing intraorbital optic nerve and papilla. No evidence for pseudotumor of the orbit, thyroid ophthalmopathy, or orbital cellulitis.  Unremarkable appearing intracranial compartment. Mild atrophy. White matter disease not typical of demyelination, most consistent with chronic microvascular ischemic change.  Findings discussed with the EDP.   Electronically Signed   By: Staci Righter M.D.   On: 08/16/2014 18:24   Mariah Torres Wo/w Cm  08/16/2014   CLINICAL DATA:  Headache and blurred vision over the RIGHT eye. RIGHT eye visual loss. Diabetes and hypertension.  EXAM: MRI HEAD AND ORBITS WITHOUT AND WITH CONTRAST  TECHNIQUE: Multiplanar, multiecho pulse sequences of the brain and surrounding structures were obtained without and with intravenous contrast. Multiplanar, multiecho pulse sequences of the orbits and surrounding structures were obtained including fat saturation techniques, before and after intravenous contrast administration.  CONTRAST:   MultiHance 20 mL.  COMPARISON:  None.  FINDINGS: MRI HEAD FINDINGS  Moderately severe motion degradation limits diagnostic accuracy.  No acute stroke or hemorrhage. No mass lesion or hydrocephalus. No extra-axial fluid. Mild atrophy. Mild subcortical and periventricular T2 and FLAIR hyperintensities, likely chronic microvascular ischemic change. No restricted diffusion or postcontrast enhancement to suggest acute demyelination. The pattern of white matter disease is not typical for a demyelinating process.  Intracranial Flow voids are maintained. No chronic hemorrhage. No midline abnormality. Calvarium intact. No sinus or mastoid fluid. Post infusion, no abnormal intracranial enhancement. Major dural venous sinuses are patent.  MRI ORBITS FINDINGS  Moderately severe motion degradation limits diagnostic accuracy.  The RIGHT optic nerve is abnormal. Definitive characterization is made difficult due to patient motion, but it appears enlarged relative to the LEFT. The nerve is T2 hyperintense throughout its course. Mild diffusion restriction is suspected on axial imaging. The RIGHT optic papilla is involved, swollen, and projects into the posterior globe. No intracranial involvement. Normal chiasm. Normal LEFT optic nerve.  No orbital inflammation aside from the optic neuritis. Orbital musculature is normal size. Normal lacrimal glands. Otherwise normal globes and periorbital soft tissues.  IMPRESSION: Motion degraded exam.  See discussion above.  Isolated acute optic neuritis, with a swollen, hyperintense, enhancing intraorbital optic nerve and papilla. No evidence for pseudotumor of the orbit, thyroid ophthalmopathy, or orbital cellulitis.  Unremarkable appearing intracranial compartment. Mild atrophy. White matter disease not typical of demyelination, most consistent with chronic microvascular ischemic change.  Findings discussed with the EDP.   Electronically Signed   By:  Staci Righter M.D.   On: 08/16/2014 18:24      Virginia Crews, MD, MPH 08/17/2014, 7:10 AM PGY-2, [place] Intern pager: [phone], text pages welcome

## 2014-08-17 NOTE — Plan of Care (Signed)
Problem: Phase I Progression Outcomes Goal: OOB as tolerated unless otherwise ordered Outcome: Completed/Met Date Met:  08/17/14 Up and about in room

## 2014-08-18 ENCOUNTER — Encounter: Payer: Self-pay | Admitting: Internal Medicine

## 2014-08-18 DIAGNOSIS — I10 Essential (primary) hypertension: Secondary | ICD-10-CM

## 2014-08-18 DIAGNOSIS — H469 Unspecified optic neuritis: Principal | ICD-10-CM

## 2014-08-18 DIAGNOSIS — E785 Hyperlipidemia, unspecified: Secondary | ICD-10-CM

## 2014-08-18 DIAGNOSIS — H5711 Ocular pain, right eye: Secondary | ICD-10-CM

## 2014-08-18 DIAGNOSIS — E119 Type 2 diabetes mellitus without complications: Secondary | ICD-10-CM

## 2014-08-18 LAB — GLUCOSE, CAPILLARY
GLUCOSE-CAPILLARY: 291 mg/dL — AB (ref 65–99)
Glucose-Capillary: 303 mg/dL — ABNORMAL HIGH (ref 65–99)

## 2014-08-18 LAB — HEMOGLOBIN A1C
Hgb A1c MFr Bld: 7.3 % — ABNORMAL HIGH (ref 4.8–5.6)
Mean Plasma Glucose: 163 mg/dL

## 2014-08-18 MED ORDER — HYDROCODONE-ACETAMINOPHEN 5-325 MG PO TABS: 1.0000 | ORAL_TABLET | ORAL | Status: DC | PRN

## 2014-08-18 MED ORDER — IBUPROFEN 400 MG PO TABS: 400.0000 mg | ORAL_TABLET | Freq: Once | ORAL | Status: DC

## 2014-08-18 NOTE — Progress Notes (Signed)
Patient discharged to home with instructions. 

## 2014-08-18 NOTE — Progress Notes (Signed)
Subjective: Patient feels no improvement with 2/3 doses solumedrol.  Still having pain in right eye and blurred vision.   Objective: Current vital signs: BP 101/57 mmHg  Pulse 101  Temp(Src) 97.8 F (36.6 C) (Oral)  Resp 17  Ht $R'5\' 5"'uw$  (1.651 m)  Wt 104.327 kg (230 lb)  BMI 38.27 kg/m2  SpO2 95% Vital signs in last 24 hours: Temp:  [97.8 F (36.6 C)-98.9 F (37.2 C)] 97.8 F (36.6 C) (08/08 0623) Pulse Rate:  [101-122] 101 (08/08 0623) Resp:  [17-20] 17 (08/08 0623) BP: (101-124)/(57-83) 101/57 mmHg (08/08 0623) SpO2:  [93 %-95 %] 95 % (08/08 0623)  Intake/Output from previous day: 08/07 0701 - 08/08 0700 In: 960 [P.O.:960] Out: -  Intake/Output this shift:   Nutritional status: Diet Carb Modified Fluid consistency:: Thin; Room service appropriate?: Yes  Neurologic Exam: General: Mental Status: Alert, oriented, thought content appropriate.  Speech fluent without evidence of aphasia.  Able to follow 3 step commands without difficulty. Cranial Nerves: II:  Visual fields grossly normal but has blurred vision and decreased color saturation in the right eye, pupils equal, reactive to light --with right APDand accommodation III,IV, VI: ptosis not present, extra-ocular motions intact bilaterally V,VII: smile symmetric, facial light touch sensation normal bilaterally VIII: hearing normal bilaterally IX,X: uvula rises symmetrically XI: bilateral shoulder shrug XII: midline tongue extension without atrophy or fasciculations  Motor: Right : Upper extremity   5/5    Left:     Upper extremity   5/5  Lower extremity   5/5     Lower extremity   5/5 Tone and bulk:normal tone throughout; no atrophy noted Sensory: Pinprick and light touch intact throughout, bilaterally Deep Tendon Reflexes:  Right: Upper Extremity   Left: Upper extremity   biceps (C-5 to C-6) 2/4   biceps (C-5 to C-6) 2/4 tricep (C7) 2/4    triceps (C7) 2/4 Brachioradialis (C6) 2/4  Brachioradialis (C6)  2/4  Lower Extremity Lower Extremity  quadriceps (L-2 to L-4) 2/4   quadriceps (L-2 to L-4) 2/4 Achilles (S1) 2/4   Achilles (S1) 2/4  Plantars: Mute bilaterally    Lab Results: Basic Metabolic Panel:  Recent Labs Lab 08/16/14 1405 08/17/14 0610  NA 138 135  K 3.6 4.2  CL 101 99*  CO2 26 26  GLUCOSE 142* 299*  BUN 14 15  CREATININE 0.81 0.93  CALCIUM 10.1 9.7    Liver Function Tests: No results for input(s): AST, ALT, ALKPHOS, BILITOT, PROT, ALBUMIN in the last 168 hours. No results for input(s): LIPASE, AMYLASE in the last 168 hours. No results for input(s): AMMONIA in the last 168 hours.  CBC:  Recent Labs Lab 08/16/14 1405 08/17/14 0610  WBC 8.0 7.8  NEUTROABS 4.4  --   HGB 13.3 13.8  HCT 38.4 40.5  MCV 87.5 87.9  PLT 261 288    Cardiac Enzymes: No results for input(s): CKTOTAL, CKMB, CKMBINDEX, TROPONINI in the last 168 hours.  Lipid Panel: No results for input(s): CHOL, TRIG, HDL, CHOLHDL, VLDL, LDLCALC in the last 168 hours.  CBG:  Recent Labs Lab 08/17/14 0732 08/17/14 1156 08/17/14 1755 08/17/14 2153 08/18/14 0809  GLUCAP 292* 377* 326* 369* 291*    Microbiology: Results for orders placed or performed during the hospital encounter of 11/20/08  Clotest (H. pylori), biopsy     Status: Abnormal   Collection Time: 11/20/08  7:38 AM  Result Value Ref Range Status   Helicobacter screen (A) UREASE NEGATIVE Final    UREASE  POSITIVE        CLOTEST DETECTS THE UREASE ENZYME OF HELICOBACTER PYLORI IN GASTRIC MUCOSAL BIOPSIES.    Coagulation Studies: No results for input(s): LABPROT, INR in the last 72 hours.  Imaging: Mr Jeri Cos KW Contrast  08/16/2014   CLINICAL DATA:  Headache and blurred vision over the RIGHT eye. RIGHT eye visual loss. Diabetes and hypertension.  EXAM: MRI HEAD AND ORBITS WITHOUT AND WITH CONTRAST  TECHNIQUE: Multiplanar, multiecho pulse sequences of the brain and surrounding structures were obtained without and with  intravenous contrast. Multiplanar, multiecho pulse sequences of the orbits and surrounding structures were obtained including fat saturation techniques, before and after intravenous contrast administration.  CONTRAST:  MultiHance 20 mL.  COMPARISON:  None.  FINDINGS: MRI HEAD FINDINGS  Moderately severe motion degradation limits diagnostic accuracy.  No acute stroke or hemorrhage. No mass lesion or hydrocephalus. No extra-axial fluid. Mild atrophy. Mild subcortical and periventricular T2 and FLAIR hyperintensities, likely chronic microvascular ischemic change. No restricted diffusion or postcontrast enhancement to suggest acute demyelination. The pattern of white matter disease is not typical for a demyelinating process.  Intracranial Flow voids are maintained. No chronic hemorrhage. No midline abnormality. Calvarium intact. No sinus or mastoid fluid. Post infusion, no abnormal intracranial enhancement. Major dural venous sinuses are patent.  MRI ORBITS FINDINGS  Moderately severe motion degradation limits diagnostic accuracy.  The RIGHT optic nerve is abnormal. Definitive characterization is made difficult due to patient motion, but it appears enlarged relative to the LEFT. The nerve is T2 hyperintense throughout its course. Mild diffusion restriction is suspected on axial imaging. The RIGHT optic papilla is involved, swollen, and projects into the posterior globe. No intracranial involvement. Normal chiasm. Normal LEFT optic nerve.  No orbital inflammation aside from the optic neuritis. Orbital musculature is normal size. Normal lacrimal glands. Otherwise normal globes and periorbital soft tissues.  IMPRESSION: Motion degraded exam.  See discussion above.  Isolated acute optic neuritis, with a swollen, hyperintense, enhancing intraorbital optic nerve and papilla. No evidence for pseudotumor of the orbit, thyroid ophthalmopathy, or orbital cellulitis.  Unremarkable appearing intracranial compartment. Mild atrophy.  White matter disease not typical of demyelination, most consistent with chronic microvascular ischemic change.  Findings discussed with the EDP.   Electronically Signed   By: Staci Righter M.D.   On: 08/16/2014 18:24   [name] Wo/w Cm  08/16/2014   CLINICAL DATA:  Headache and blurred vision over the RIGHT eye. RIGHT eye visual loss. Diabetes and hypertension.  EXAM: MRI HEAD AND ORBITS WITHOUT AND WITH CONTRAST  TECHNIQUE: Multiplanar, multiecho pulse sequences of the brain and surrounding structures were obtained without and with intravenous contrast. Multiplanar, multiecho pulse sequences of the orbits and surrounding structures were obtained including fat saturation techniques, before and after intravenous contrast administration.  CONTRAST:  MultiHance 20 mL.  COMPARISON:  None.  FINDINGS: MRI HEAD FINDINGS  Moderately severe motion degradation limits diagnostic accuracy.  No acute stroke or hemorrhage. No mass lesion or hydrocephalus. No extra-axial fluid. Mild atrophy. Mild subcortical and periventricular T2 and FLAIR hyperintensities, likely chronic microvascular ischemic change. No restricted diffusion or postcontrast enhancement to suggest acute demyelination. The pattern of white matter disease is not typical for a demyelinating process.  Intracranial Flow voids are maintained. No chronic hemorrhage. No midline abnormality. Calvarium intact. No sinus or mastoid fluid. Post infusion, no abnormal intracranial enhancement. Major dural venous sinuses are patent.  MRI ORBITS FINDINGS  Moderately severe motion degradation limits diagnostic accuracy.  The  RIGHT optic nerve is abnormal. Definitive characterization is made difficult due to patient motion, but it appears enlarged relative to the LEFT. The nerve is T2 hyperintense throughout its course. Mild diffusion restriction is suspected on axial imaging. The RIGHT optic papilla is involved, swollen, and projects into the posterior globe. No intracranial  involvement. Normal chiasm. Normal LEFT optic nerve.  No orbital inflammation aside from the optic neuritis. Orbital musculature is normal size. Normal lacrimal glands. Otherwise normal globes and periorbital soft tissues.  IMPRESSION: Motion degraded exam.  See discussion above.  Isolated acute optic neuritis, with a swollen, hyperintense, enhancing intraorbital optic nerve and papilla. No evidence for pseudotumor of the orbit, thyroid ophthalmopathy, or orbital cellulitis.  Unremarkable appearing intracranial compartment. Mild atrophy. White matter disease not typical of demyelination, most consistent with chronic microvascular ischemic change.  Findings discussed with the EDP.   Electronically Signed   By: Staci Righter M.D.   On: 08/16/2014 18:24    Medications:  Scheduled: . atorvastatin  40 mg Oral QHS  . cholecalciferol  2,000 Units Oral Daily  . enoxaparin (LOVENOX) injection  40 mg Subcutaneous Q24H  . glipiZIDE  10 mg Oral Q breakfast  . losartan  100 mg Oral Daily   And  . hydrochlorothiazide  25 mg Oral Daily  . insulin aspart  0-15 Units Subcutaneous TID WC  . methylPREDNISolone (SOLU-MEDROL) injection  1,000 mg Intravenous Daily    Assessment/Plan: 59 year old female with a family history of MS who presents with signs and symptoms consistent with optic neuritis. MRI of the brain  shows right optic nerve enhancement but no evidence of MS lesions otherwise. she has received 2/3 doses solumedrol with no significant improvement. ESR 44.    Recommend: 1) Finish last dose of solumedrol 2) Follow up with out patient neurology and opthamology  No further recommendations at this time.   Etta Quill PA-C [facility] [phone]  08/18/2014, 9:37 AM  I personally participated in this patient's evaluation and management, including formulating the above clinical impression and management recommendations.  Rush Farmer M.D. [facility] [phone]

## 2014-08-18 NOTE — Care Management Note (Signed)
Case Management Note  Patient Details  Name: Mariah Torres MRN: [id number] Date of Birth: 1955-04-08  Subjective/Objective:                    Action/Plan:  UR updated  Expected Discharge Date:                  Expected Discharge Plan:  Home/Self Care  In-House Referral:     Discharge planning Services     Post Acute Care Choice:    Choice offered to:     DME Arranged:    DME Agency:     HH Arranged:    [place] Agency:     Status of Service:  In process, will continue to follow  Medicare Important Message Given:    Date Medicare IM Given:    Medicare IM give by:    Date Additional Medicare IM Given:    Additional Medicare Important Message give by:     If discussed at [place] of Stay Meetings, dates discussed:    Additional Comments:  Marilu Favre, RN 08/18/2014, 2:48 PM

## 2014-08-18 NOTE — Progress Notes (Signed)
Inpatient Diabetes Program Recommendations  AACE/ADA: New Consensus Statement on Inpatient Glycemic Control (2013)  Target Ranges:  Prepandial:   less than 140 mg/dL      Peak postprandial:   less than 180 mg/dL (1-2 hours)      Critically ill patients:  140 - 180 mg/dL   Results for Mariah Torres, SPROULE (MRN [id number]) as of 08/18/2014 12:13  Ref. Range 08/17/2014 07:32 08/17/2014 11:56 08/17/2014 17:55 08/17/2014 21:53 08/18/2014 08:09  Glucose-Capillary Latest Ref Range: 65-99 mg/dL 292 (H) 377 (H) 326 (H) 369 (H) 291 (H)    Diabetes history: DM2 Outpatient Diabetes medications: Glipizide 10 mg daily, Metformin 1000 mg BID Current orders for Inpatient glycemic control: Novolog 0-15 units TID with meals, Glipizide 10 mg QAM  Inpatient Diabetes Program Recommendations Insulin - Basal: Noted patient has been receiving Solumedrol 1000 mg Q24H which has contributed to hyperglycemia. Note last Solumedrol dose was given today at 11am and no other steroids are ordered at this time. May want to consider ordering a one time dose of Lantus 10 units. Correction (SSI): Please consider increasing Novolog correction to resistant scale and adding Novolog bedtime correction scale.  Thanks, Barnie Alderman, RN, MSN, CCRN, CDE Diabetes Coordinator Inpatient Diabetes Program [phone] (Team Pager from [place] to [place]) [phone] ([place] office) [phone] [facility] office) [phone] [facility] office)

## 2014-08-18 NOTE — Discharge Instructions (Signed)
You were seen at [facility] for optic neuritis. An MRI was performed and was found to be positive for optic neuritis without any further concerns noted. You were given a 3 day high dose regiment of steroids. You will now need to follow up with neurology for further work up. I have called them and left a message. However if they do not contact you by Friday please contact them. Please follow up with your primary care physician in the next 3 days   Optic Neuritis   What is optic neuritis? -- Optic neuritis is a condition that causes vision problems, eye pain, and other symptoms. It happens when the nerve that goes from the eye to the brain gets inflamed (figure 1). This nerve is called the optic nerve.  Optic neuritis is common in people who have a disease called multiple sclerosis (called MS, for short). It can also happen in people who have no other health conditions.  Optic neuritis sometimes happens to people who have conditions other than MS. But this is not common. The conditions include:  ?Infections - Children are more likely to get optic neuritis after an infection, such as chickenpox or the flu. ?Diseases that affect the bodys infection-fighting system (called the immune system) - These include lupus and sarcoid. What are the symptoms of optic neuritis? -- Optic neuritis usually happens in one eye at a time. But it sometimes happens in both eyes. The main symptoms include:  ?Vision loss - A person with optic neuritis usually has trouble seeing clearly from one or both eyes. Vision usually gets worse for several hours or days. Most people do not lose all their vision. Vision usually starts to get better in a few weeks. ?Eye pain - The eyes might hurt more when they move. Pain usually starts when vision starts to get worse. As vision gets better, the pain goes away. Less common symptoms include: ?Seeing flashes or flickers of light ?Having trouble seeing colors - You might think that  bright colors look dark Should I see a doctor or nurse? -- See your doctor or nurse right away if you start to lose your vision.  Will I need tests? -- Yes. Doctors will do tests to see if optic neuritis is the cause of your symptoms. They will also check for signs of MS. This is because optic neuritis can be the first symptom of MS.  You will have the following tests:  ?Dilated eye exam - During this exam, the eye doctor gives you eye drops to make your pupils open up. (When your pupils are open, it is easier for the doctor to see the different parts of the inside of your eye.) After the drops open your pupils, the doctor looks at the back of your eye, where the optic nerve is. He or she looks for signs of optic neuritis. ?MRI of the brain - This is an imaging test that creates pictures of the brain. It can show changes to the optic nerve and brain. It can help doctors find MS or another condition that is causing the optic neuritis.   How is optic neuritis treated? -- Optic neuritis usually gets better without treatment. This can take a few weeks or many months.  Doctors can treat severe optic neuritis with steroid medicines given through a thin tube called an IV. The tube goes into a vein. The medicines help the optic neuritis go away faster.  If you have optic neuritis and your MRI shows you  are at high risk of getting MS, your doctor might give you medicines. These medicines can slow down the MS or keep it from happening. They include interferon beta-1a (sample brand names: Avonex, Rebif), interferon beta 1b (sample brand names: Betaseron, Shiela Mayer), or glatiramer acetate (brand name: Copaxone).  Will my vision get better? -- Most people start to see better in a few weeks. After a year, most people have vision that is almost as good as before the optic neuritis. But some people have lifelong vision loss or even blindness.  About 1 of every 3 people who get optic neuritis gets it again  later.  Will I get MS? -- About half the people who get optic neuritis get MS in the next 15 years. If the results of your MRI are normal, you have less chance of getting MS. If the MRI shows changes to the optic nerve and brain, you have a higher chance of MS. People with more changes have a higher risk than people with just a few.

## 2014-08-18 NOTE — Progress Notes (Signed)
Family Medicine Teaching Service Daily Progress Note Intern Pager: [phone]  Patient name: Mariah Torres Medical record number: [id number] Date of birth: 1955/12/10 Age: 59 y.o. Gender: female  Primary Care Provider: No primary care provider on file. Consultants: Neuro, Optho Code Status: full code  Pt Overview and Major Events to Date:  8/6 - admit for optic neuritis, neuro consulted, started high dose IV steroids  Assessment and Plan: Mariah Torres is a 59 y.o. female presenting with R optic neuritis. PMH is significant for HTN, HLD, T2DM.  Right Optic Neuritis: MRI with isolated optic neuritis without white matter demyelination. Concern for MS given strong family history.   - Optho consulted - needs visual fields testing at discharge (have her go to Dr. Ellie Lunch soon after discharge) - Solumedrol 1029m IV daily x3 days (D3) - Will need 8 days of prednisone 173mkg with a 4 day taper.  - Needs Neuro f/u as OP - Pain control with Norco q4h prn, 2 dose   HTN: Normotensive. BP 101/57 this AM - Continue home losartan-HCTZ 100-2547maily - Continue to monitor in setting of high dose steroids  T2DM: CBGs 377, 326, 369,  high dose steroids - Repeat A1c pending - Restart Metformin - Continue home glipizide - Increase Insulin scale  HLD: Last lipid panel 01/2014 with LDL 108, HDL 55 - Continue home Atorvastatin 43m30mily - Consider daily aspirin therapy for primary prevention  FEN/GI: SLIV, Carb mod diet Prophylaxis: Lovenox  Disposition: med-surg status, d/c pending clinical improvement and completion of 3d course of high dose steroids   Subjective:  Right eyes is about the same according to patient. Denies any weakness, SOB, chest pain, n/v, dizziness    Objective: Temp:  [97.8 F (36.6 C)-98.9 F (37.2 C)] 97.8 F (36.6 C) (08/08 0623) Pulse Rate:  [101-122] 101 (08/08 0623) Resp:  [17-20] 17 (08/08 0623) BP: (101-124)/(57-83) 101/57 mmHg (08/08 0623) SpO2:  [93 %-95  %] 95 % (08/08 06238110hysical Exam: General: NAD, sitting comfortably in bed HEENT: NCAT, MMM, PERRL, EOMI, sclera clear Cardiovascular: RRR, no m/r/g. Intact distal pulses Respiratory: CTAB, no w/r/c. Normal WOB Extremities: WWP, no edema Neuro: AAOx3, speech fluent,  Laboratory:  Recent Labs Lab 08/16/14 1405 08/17/14 0610  WBC 8.0 7.8  HGB 13.3 13.8  HCT 38.4 40.5  PLT 261 288    Recent Labs Lab 08/16/14 1405 08/17/14 0610  NA 138 135  K 3.6 4.2  CL 101 99*  CO2 26 26  BUN 14 15  CREATININE 0.81 0.93  CALCIUM 10.1 9.7  GLUCOSE 142* 299*    ESR 44, CRP <0.5  Imaging/Diagnostic Tests: Mariah Torres  08/16/2014   CLINICAL DATA:  Headache and blurred vision over the RIGHT eye. RIGHT eye visual loss. Diabetes and hypertension.  EXAM: MRI HEAD AND ORBITS WITHOUT AND WITH CONTRAST  TECHNIQUE: Multiplanar, multiecho pulse sequences of the brain and surrounding structures were obtained without and with intravenous contrast. Multiplanar, multiecho pulse sequences of the orbits and surrounding structures were obtained including fat saturation techniques, before and after intravenous contrast administration.  CONTRAST:  MultiHance 20 mL.  COMPARISON:  None.  FINDINGS: MRI HEAD FINDINGS  Moderately severe motion degradation limits diagnostic accuracy.  No acute stroke or hemorrhage. No mass lesion or hydrocephalus. No extra-axial fluid. Mild atrophy. Mild subcortical and periventricular T2 and FLAIR hyperintensities, likely chronic microvascular ischemic change. No restricted diffusion or postcontrast enhancement to suggest acute demyelination. The pattern of white matter disease is  not typical for a demyelinating process.  Intracranial Flow voids are maintained. No chronic hemorrhage. No midline abnormality. Calvarium intact. No sinus or mastoid fluid. Post infusion, no abnormal intracranial enhancement. Major dural venous sinuses are patent.  MRI ORBITS FINDINGS  Moderately  severe motion degradation limits diagnostic accuracy.  The RIGHT optic nerve is abnormal. Definitive characterization is made difficult due to patient motion, but it appears enlarged relative to the LEFT. The nerve is T2 hyperintense throughout its course. Mild diffusion restriction is suspected on axial imaging. The RIGHT optic papilla is involved, swollen, and projects into the posterior globe. No intracranial involvement. Normal chiasm. Normal LEFT optic nerve.  No orbital inflammation aside from the optic neuritis. Orbital musculature is normal size. Normal lacrimal glands. Otherwise normal globes and periorbital soft tissues.  IMPRESSION: Motion degraded exam.  See discussion above.  Isolated acute optic neuritis, with a swollen, hyperintense, enhancing intraorbital optic nerve and papilla. No evidence for pseudotumor of the orbit, thyroid ophthalmopathy, or orbital cellulitis.  Unremarkable appearing intracranial compartment. Mild atrophy. White matter disease not typical of demyelination, most consistent with chronic microvascular ischemic change.  Findings discussed with the EDP.   Electronically Signed   By: Staci Righter M.D.   On: 08/16/2014 18:24   Mariah Mariah Torres Wo/w Cm  08/16/2014   CLINICAL DATA:  Headache and blurred vision over the RIGHT eye. RIGHT eye visual loss. Diabetes and hypertension.  EXAM: MRI HEAD AND ORBITS WITHOUT AND WITH CONTRAST  TECHNIQUE: Multiplanar, multiecho pulse sequences of the brain and surrounding structures were obtained without and with intravenous contrast. Multiplanar, multiecho pulse sequences of the orbits and surrounding structures were obtained including fat saturation techniques, before and after intravenous contrast administration.  CONTRAST:  MultiHance 20 mL.  COMPARISON:  None.  FINDINGS: MRI HEAD FINDINGS  Moderately severe motion degradation limits diagnostic accuracy.  No acute stroke or hemorrhage. No mass lesion or hydrocephalus. No extra-axial fluid. Mild  atrophy. Mild subcortical and periventricular T2 and FLAIR hyperintensities, likely chronic microvascular ischemic change. No restricted diffusion or postcontrast enhancement to suggest acute demyelination. The pattern of white matter disease is not typical for a demyelinating process.  Intracranial Flow voids are maintained. No chronic hemorrhage. No midline abnormality. Calvarium intact. No sinus or mastoid fluid. Post infusion, no abnormal intracranial enhancement. Major dural venous sinuses are patent.  MRI ORBITS FINDINGS  Moderately severe motion degradation limits diagnostic accuracy.  The RIGHT optic nerve is abnormal. Definitive characterization is made difficult due to patient motion, but it appears enlarged relative to the LEFT. The nerve is T2 hyperintense throughout its course. Mild diffusion restriction is suspected on axial imaging. The RIGHT optic papilla is involved, swollen, and projects into the posterior globe. No intracranial involvement. Normal chiasm. Normal LEFT optic nerve.  No orbital inflammation aside from the optic neuritis. Orbital musculature is normal size. Normal lacrimal glands. Otherwise normal globes and periorbital soft tissues.  IMPRESSION: Motion degraded exam.  See discussion above.  Isolated acute optic neuritis, with a swollen, hyperintense, enhancing intraorbital optic nerve and papilla. No evidence for pseudotumor of the orbit, thyroid ophthalmopathy, or orbital cellulitis.  Unremarkable appearing intracranial compartment. Mild atrophy. White matter disease not typical of demyelination, most consistent with chronic microvascular ischemic change.  Findings discussed with the EDP.   Electronically Signed   By: Staci Righter M.D.   On: 08/16/2014 18:24     Edith Lord Cletis Media, MD, MPH 08/18/2014, 10:00 AM PGY-1, [place] Intern pager: [phone], text  pages welcome

## 2014-08-18 NOTE — Discharge Summary (Signed)
[facility] Discharge Summary  Patient name: Mariah Torres Medical record number: [id number] Date of birth: 01/02/1956 Age: 59 y.o. Gender: female Date of Admission: 08/16/2014  Date of Discharge: 08/18/2014  Admitting Physician: Lupita Dawn, MD  Primary Care Provider: No primary care provider on file. Consultants:   Indication for Hospitalization:   Discharge Diagnoses/Problem List:  Optic Neuritis  T2DM HLD  HTN  Disposition: Home   Discharge Condition: Stable, Good   Discharge Exam:  General: NAD, sitting comfortably in bed HEENT: NCAT, MMM, PERRL, EOMI, sclera clear Cardiovascular: RRR, no m/r/g. Intact distal pulses Respiratory: CTAB, no w/r/c. Normal WOB Extremities: WWP, no edema Neuro: AAOx3, speech fluent  Brief Hospital Course:  Patient was admitted for right eye pain and blurry vision with significant family hx of MS. She reported symptoms of R eye pain, HAs, blurred vision, worsening color vision, and eyelid swelling that started about 1.5-2 weeks ago. No other neurologic symptoms. Neurology was consulted. MRI was performed and found to have isolated acute optic neuritis, with a swollen, hyperintense, enhancing intraorbital optic nerve and papilla. She also had ESR 44. Patient received a 3 days of high dose solumedrol. During her stay, her blood sugars were elevated in the 300s due to administration of steroids, patient received correctional Novolog, metformin was held. However on discharge, she was restarted on metformin which was held. Patient to follow up with opthalmology on 8/9 as well as neurology   Issues for Follow Up:  1. Follow up on patient's blood sugars, HA1C 7.3 during stay  2. Follow up patient's blood pressures  3. Follow up on patient's neurology appointment and opthalmology appointments   Significant Procedures:   Significant Labs and Imaging:   Recent Labs Lab 08/16/14 1405 08/17/14 0610  WBC 8.0 7.8  HGB  13.3 13.8  HCT 38.4 40.5  PLT 261 288    Recent Labs Lab 08/16/14 1405 08/17/14 0610  NA 138 135  K 3.6 4.2  CL 101 99*  CO2 26 26  GLUCOSE 142* 299*  BUN 14 15  CREATININE 0.81 0.93  CALCIUM 10.1 9.7    Mr Brain W Wo Contrast  08/16/2014   CLINICAL DATA:  Headache and blurred vision over the RIGHT eye. RIGHT eye visual loss. Diabetes and hypertension.  EXAM: MRI HEAD AND ORBITS WITHOUT AND WITH CONTRAST  TECHNIQUE: Multiplanar, multiecho pulse sequences of the brain and surrounding structures were obtained without and with intravenous contrast. Multiplanar, multiecho pulse sequences of the orbits and surrounding structures were obtained including fat saturation techniques, before and after intravenous contrast administration.  CONTRAST:  MultiHance 20 mL.  COMPARISON:  None.  FINDINGS: MRI HEAD FINDINGS  Moderately severe motion degradation limits diagnostic accuracy.  No acute stroke or hemorrhage. No mass lesion or hydrocephalus. No extra-axial fluid. Mild atrophy. Mild subcortical and periventricular T2 and FLAIR hyperintensities, likely chronic microvascular ischemic change. No restricted diffusion or postcontrast enhancement to suggest acute demyelination. The pattern of white matter disease is not typical for a demyelinating process.  Intracranial Flow voids are maintained. No chronic hemorrhage. No midline abnormality. Calvarium intact. No sinus or mastoid fluid. Post infusion, no abnormal intracranial enhancement. Major dural venous sinuses are patent.  MRI ORBITS FINDINGS  Moderately severe motion degradation limits diagnostic accuracy.  The RIGHT optic nerve is abnormal. Definitive characterization is made difficult due to patient motion, but it appears enlarged relative to the LEFT. The nerve is T2 hyperintense throughout its course. Mild diffusion restriction is suspected  on axial imaging. The RIGHT optic papilla is involved, swollen, and projects into the posterior globe. No  intracranial involvement. Normal chiasm. Normal LEFT optic nerve.  No orbital inflammation aside from the optic neuritis. Orbital musculature is normal size. Normal lacrimal glands. Otherwise normal globes and periorbital soft tissues.  IMPRESSION: Motion degraded exam.  See discussion above.  Isolated acute optic neuritis, with a swollen, hyperintense, enhancing intraorbital optic nerve and papilla. No evidence for pseudotumor of the orbit, thyroid ophthalmopathy, or orbital cellulitis.  Unremarkable appearing intracranial compartment. Mild atrophy. White matter disease not typical of demyelination, most consistent with chronic microvascular ischemic change.  Findings discussed with the EDP.   Electronically Signed   By: Staci Righter M.D.   On: 08/16/2014 18:24   [name] Wo/w Cm  08/16/2014   CLINICAL DATA:  Headache and blurred vision over the RIGHT eye. RIGHT eye visual loss. Diabetes and hypertension.  EXAM: MRI HEAD AND ORBITS WITHOUT AND WITH CONTRAST  TECHNIQUE: Multiplanar, multiecho pulse sequences of the brain and surrounding structures were obtained without and with intravenous contrast. Multiplanar, multiecho pulse sequences of the orbits and surrounding structures were obtained including fat saturation techniques, before and after intravenous contrast administration.  CONTRAST:  MultiHance 20 mL.  COMPARISON:  None.  FINDINGS: MRI HEAD FINDINGS  Moderately severe motion degradation limits diagnostic accuracy.  No acute stroke or hemorrhage. No mass lesion or hydrocephalus. No extra-axial fluid. Mild atrophy. Mild subcortical and periventricular T2 and FLAIR hyperintensities, likely chronic microvascular ischemic change. No restricted diffusion or postcontrast enhancement to suggest acute demyelination. The pattern of white matter disease is not typical for a demyelinating process.  Intracranial Flow voids are maintained. No chronic hemorrhage. No midline abnormality. Calvarium intact. No sinus or  mastoid fluid. Post infusion, no abnormal intracranial enhancement. Major dural venous sinuses are patent.  MRI ORBITS FINDINGS  Moderately severe motion degradation limits diagnostic accuracy.  The RIGHT optic nerve is abnormal. Definitive characterization is made difficult due to patient motion, but it appears enlarged relative to the LEFT. The nerve is T2 hyperintense throughout its course. Mild diffusion restriction is suspected on axial imaging. The RIGHT optic papilla is involved, swollen, and projects into the posterior globe. No intracranial involvement. Normal chiasm. Normal LEFT optic nerve.  No orbital inflammation aside from the optic neuritis. Orbital musculature is normal size. Normal lacrimal glands. Otherwise normal globes and periorbital soft tissues.  IMPRESSION: Motion degraded exam.  See discussion above.  Isolated acute optic neuritis, with a swollen, hyperintense, enhancing intraorbital optic nerve and papilla. No evidence for pseudotumor of the orbit, thyroid ophthalmopathy, or orbital cellulitis.  Unremarkable appearing intracranial compartment. Mild atrophy. White matter disease not typical of demyelination, most consistent with chronic microvascular ischemic change.  Findings discussed with the EDP.   Electronically Signed   By: Staci Righter M.D.   On: 08/16/2014 18:24   Results/Tests Pending at Time of Discharge:None   Discharge Medications:    Medication List    ASK your doctor about these medications        amoxicillin-clavulanate 500-125 MG per tablet  Commonly known as:  AUGMENTIN  Take 1 tablet by mouth every 8 (eight) hours. 14 day course started 08/15/14     atorvastatin 40 MG tablet  Commonly known as:  LIPITOR  Take 40 mg by mouth at bedtime.     cholecalciferol 1000 UNITS tablet  Commonly known as:  VITAMIN D  Take 2,000 Units by mouth daily.     fluticasone  50 MCG/ACT nasal spray  Commonly known as:  FLONASE  Place 1 spray into both nostrils daily as  needed for allergies or rhinitis.     glipiZIDE 10 MG 24 hr tablet  Commonly known as:  GLUCOTROL XL  Take 10 mg by mouth daily.     ibuprofen 200 MG tablet  Commonly known as:  ADVIL,MOTRIN  Take 400 mg by mouth every 6 (six) hours as needed (pain).     ibuprofen 600 MG tablet  Commonly known as:  ADVIL,MOTRIN  Take 600 mg by mouth daily as needed (pain).     losartan-hydrochlorothiazide 100-25 MG per tablet  Commonly known as:  HYZAAR  Take 1 tablet by mouth daily.     metFORMIN 1000 MG tablet  Commonly known as:  GLUCOPHAGE  Take 1,000 mg by mouth 2 (two) times daily with a meal.     triamcinolone ointment 0.5 %  Commonly known as:  KENALOG  Apply 1 application topically 2 (two) times daily as needed (rash).        Discharge Instructions: Please refer to Patient Instructions section of EMR for full details.  Patient was counseled important signs and symptoms that should prompt return to medical care, changes in medications, dietary instructions, activity restrictions, and follow up appointments.   Follow-Up Appointments:   Asiyah Cletis Media, MD 08/18/2014, 2:51 PM PGY-1, [place]

## 2014-08-20 ENCOUNTER — Ambulatory Visit (INDEPENDENT_AMBULATORY_CARE_PROVIDER_SITE_OTHER): Payer: BLUE CROSS/BLUE SHIELD | Admitting: Neurology

## 2014-08-20 ENCOUNTER — Encounter: Payer: Self-pay | Admitting: Neurology

## 2014-08-20 VITALS — BP 132/90 | HR 84 | Resp 16 | Ht 65.0 in | Wt 227.6 lb

## 2014-08-20 DIAGNOSIS — H469 Unspecified optic neuritis: Secondary | ICD-10-CM

## 2014-08-20 DIAGNOSIS — F411 Generalized anxiety disorder: Secondary | ICD-10-CM | POA: Insufficient documentation

## 2014-08-20 DIAGNOSIS — R6889 Other general symptoms and signs: Secondary | ICD-10-CM | POA: Insufficient documentation

## 2014-08-20 DIAGNOSIS — R930 Abnormal findings on diagnostic imaging of skull and head, not elsewhere classified: Secondary | ICD-10-CM | POA: Diagnosis not present

## 2014-08-20 DIAGNOSIS — R9089 Other abnormal findings on diagnostic imaging of central nervous system: Secondary | ICD-10-CM | POA: Insufficient documentation

## 2014-08-20 MED ORDER — ESCITALOPRAM OXALATE 10 MG PO TABS: 10.0000 mg | ORAL_TABLET | Freq: Every day | ORAL | Status: DC

## 2014-08-20 MED ORDER — DIAZEPAM 5 MG PO TABS: 5.0000 mg | ORAL_TABLET | Freq: Every evening | ORAL | Status: DC | PRN

## 2014-08-20 NOTE — Progress Notes (Signed)
[facility]  PATIENT: Mariah Torres DOB: January 19, 1955  REFERRING DOCTOR OR PCP:  Billey Chang SOURCE: patient and records from EMR and MRI images on PACS  _________________________________   HISTORICAL  CHIEF COMPLAINT:  Chief Complaint  Patient presents with  . Optic Neuritis    Sts. onset of right eye pain, blurry vision 2 weeks ago.  She was seen by her opthalmologist and sts. was being treated for ocular cellulitis.  She continued to get worse, was seen in the ED at [facility] on 08-16-14 and was dx. with optic neuritis.  She received 3 days of IV SoluMedrol.  Sts. mri brain showed no lesions.  She was d/c to f/u with neurology to r/o MS.  Sts. her twin sister and mother both have MS. Her sister is a pt. of Dr. Jannifer Franklin' here at [facility]./fim    HISTORY OF PRESENT ILLNESS:  Mariah Torres is a 59 year old woman with optic neuritis. She was in her usual state of health until about 12 days ago when she started to experience pain over  the right eye.  A couple days later, she began to notice graying of vision out of the right eye. Specifically colors were very desaturated. Visual acuity was a little reduced   She went to her optometrist who felt that she might have an cellulitis and place her on some antibiotics. When she did not improve advised to get an MRI of the brain. The MRI of the brain and orbits showed restricted diffusion of the right optic nerve, with enhancement after gadolinium was administered. This was consistent with optic neuritis. Additionally, by my review, there are about a dozen T2/FLAIR hyperintense foci. Most of these appear fairly nonspecific being round and in the deep or subcortical white matter.  A couple are periventricular.   She was admitted to the hospital and received 3 days of IV steroids. Her last dose of steroids was a couple days ago. She feels that her vision is about the same now as it was last week.  About 6 months ago she had a few days of  vertigo. She noticed a spinning sensation. Of note she also was experiencing some sinusitis around that time.  She denies any difficulty with her gait, strength or sensation. She has some urinary frequency but this has been an issue for many years.  She has a positive family history of multiple sclerosis. Specifically, her identical twin sister was diagnosed about 14-15 years ago. Her mother also had MS.    REVIEW OF SYSTEMS: Constitutional: No fevers, chills, sweats, or change in appetite Eyes: No visual changes, double vision, eye pain Ear, nose and throat: No hearing loss, ear pain, nasal congestion, sore throat Cardiovascular: No chest pain, palpitations Respiratory: No shortness of breath at rest or with exertion.   No wheezes GastrointestinaI: No nausea, vomiting, diarrhea, abdominal pain, fecal incontinence Genitourinary: No dysuria, urinary retention or frequency.  No nocturia. Musculoskeletal: No neck pain, back pain Integumentary: No rash, pruritus, skin lesions Neurological: as above Psychiatric: No depression at this time.  No anxiety Endocrine: No palpitations, diaphoresis, change in appetite, change in weigh or increased thirst Hematologic/Lymphatic: No anemia, purpura, petechiae. Allergic/Immunologic: No itchy/runny eyes, nasal congestion, recent allergic reactions, rashes  ALLERGIES: Allergies  Allergen Reactions  . Aleve [Naproxen Sodium] Anaphylaxis  . Codeine Itching and Other (See Comments)    jittery  . Ace Inhibitors Cough    HOME MEDICATIONS:  Current outpatient prescriptions:  .  atorvastatin (LIPITOR) 40  MG tablet, Take 40 mg by mouth at bedtime., Disp: , Rfl: 0 .  cholecalciferol (VITAMIN D) 1000 UNITS tablet, Take 2,000 Units by mouth daily., Disp: , Rfl:  .  fluticasone (FLONASE) 50 MCG/ACT nasal spray, Place 1 spray into both nostrils daily as needed for allergies or rhinitis., Disp: , Rfl:  .  glipiZIDE (GLUCOTROL XL) 10 MG 24 hr tablet, Take  10 mg by mouth daily., Disp: , Rfl:  .  HYDROcodone-acetaminophen (NORCO/VICODIN) 5-325 MG per tablet, Take 1 tablet by mouth every 4 (four) hours as needed for moderate pain., Disp: 10 tablet, Rfl: 0 .  losartan-hydrochlorothiazide (HYZAAR) 100-25 MG per tablet, Take 1 tablet by mouth daily., Disp: , Rfl: 2 .  metFORMIN (GLUCOPHAGE) 1000 MG tablet, Take 1,000 mg by mouth 2 (two) times daily with a meal., Disp: , Rfl:  .  triamcinolone ointment (KENALOG) 0.5 %, Apply 1 application topically 2 (two) times daily as needed (rash). , Disp: , Rfl:   PAST MEDICAL HISTORY: Past Medical History  Diagnosis Date  . Hypertension   . Diabetes mellitus without complication   . Obesity   . High cholesterol     PAST SURGICAL HISTORY: History reviewed. No pertinent past surgical history.  FAMILY HISTORY: History reviewed. No pertinent family history.  SOCIAL HISTORY:  Social History   Social History  . Marital Status: Divorced    Spouse Name: N/A  . Number of Children: N/A  . Years of Education: N/A   Occupational History  . Not on file.   Social History Main Topics  . Smoking status: Former Research scientist (life sciences)  . Smokeless tobacco: Not on file  . Alcohol Use: 0.0 oz/week    0 Standard drinks or equivalent per week     Comment: Rare  . Drug Use: No  . Sexual Activity: Not on file   Other Topics Concern  . Not on file   Social History Narrative     PHYSICAL EXAM  Filed Vitals:   08/20/14 1552  BP: 132/90  Pulse: 84  Resp: 16  Height: 5\' 5"  (1.651 m)  Weight: 227 lb 9.6 oz (103.239 kg)    Body mass index is 37.87 kg/(m^2).   General: The patient is well-developed and well-nourished and in no acute distress  Eyes:  Funduscopic exam shows normal optic discs and retinal vessels.  Neck: The neck is supple, no carotid bruits are noted.  The neck is nontender.  Cardiovascular: The heart has a regular rate and rhythm with a normal S1 and S2. There were no murmurs, gallops or rubs.    Skin: Extremities are without significant edema.  Musculoskeletal:  Back is nontender  Neurologic Exam  Mental status: The patient is alert and oriented x 3 at the time of the examination. The patient has apparent normal recent and remote memory, with an apparently normal attention span and concentration ability.   Speech is normal.  Cranial nerves: Extraocular movements are full. She has a 2+ APD on the right. Color vision is reduced markedly out of the right eye.  Visual fields are full.  Facial symmetry is present. There is good facial sensation to soft touch bilaterally.Facial strength is normal.  Trapezius and sternocleidomastoid strength is normal. No dysarthria is noted.  The tongue is midline, and the patient has symmetric elevation of the soft palate. No obvious hearing deficits are noted.  Motor:  Muscle bulk is normal.   Tone is normal. Strength is  5 / 5 in all 4 extremities.  Sensory: Sensory testing is intact to pinprick, soft touch and vibration sensation in all 4 extremities.  Coordination: Cerebellar testing reveals good finger-nose-finger and heel-to-shin bilaterally.  Gait and station: Station is normal.   Gait is normal but tandem gait is wide Romberg is negative.   Reflexes: Deep tendon reflexes are symmetric and normal bilaterally.   Plantar responses are flexor.    DIAGNOSTIC DATA (LABS, IMAGING, TESTING) - I reviewed patient records, labs, notes, testing and imaging myself where available.  Lab Results  Component Value Date   WBC 7.8 08/17/2014   HGB 13.8 08/17/2014   HCT 40.5 08/17/2014   MCV 87.9 08/17/2014   PLT 288 08/17/2014      Component Value Date/Time   NA 135 08/17/2014 0610   K 4.2 08/17/2014 0610   CL 99* 08/17/2014 0610   CO2 26 08/17/2014 0610   GLUCOSE 299* 08/17/2014 0610   BUN 15 08/17/2014 0610   CREATININE 0.93 08/17/2014 0610   CALCIUM 9.7 08/17/2014 0610   GFRNONAA >60 08/17/2014 0610   GFRAA >60 08/17/2014 0610   No  results found for: CHOL, HDL, LDLCALC, LDLDIRECT, TRIG, CHOLHDL Lab Results  Component Value Date   HGBA1C 7.3* 08/17/2014       ASSESSMENT AND PLAN  Optic neuritis - Plan: Sedimentation rate, ANA w/Reflex, Pan-ANCA, Neuromyelitis optica autoab, IgG  Abnormal finding on MRI of brain  Anxiety state   In summary, Mariah Torres is a 59 year old woman with right optic neuritis and a strong family history of multiple sclerosis. Her MRI of the brain is abnormal. Most of the dozen or so white matter foci are in the deep white matter or subcortical white matter with an appearance more consistent with microvascular ischemic changes. Risk factors include diabetes and hypertension.  However a couple are periventricular and I cannot rule out the possibility of multiple sclerosis. I discussed this with her and her daughter. I will have her get a lumbar puncture and we will send spinal fluid for oligoclonal bands and IgG index as well as standard labs. If the CSF is consistent with MS, then I would want her to start a disease modifying therapy. If it is more normal, then I would recommend that we repeat an MRI later in the year to determine if there has been changes that would be more concerning for MS. A stable MRI reduces the likelihood that she has MS. Interestingly, her identical twin sister has MS and twin studies have shown that she might have a 1/4 chance of MS.  She also does have anxiety and I placed her on Lexapro. I also provided 15 Valium tablets to take at bedtime as needed. If insomnia becomes more chronic, we will switch her to something else.  She will return to see me in 6 weeks or sooner if she has new or worsening neurologic symptoms.   Garo Heidelberg A. Felecia Shelling, MD, PhD 6/80/3212, 2:48 PM Certified in Neurology, Clinical Neurophysiology, Sleep Medicine, Pain Medicine and Neuroimaging  [facility] [address], [place], [place] [address] [phone]

## 2014-08-22 LAB — PAN-ANCA
ANCA Proteinase 3: <3.5 U/mL (ref 0.0–3.5)
C-ANCA: <1:20 {titer}
Myeloperoxidase Ab: <9 U/mL (ref 0.0–9.0)

## 2014-08-22 LAB — ANA W/REFLEX: Anti Nuclear Antibody(ANA): NEGATIVE

## 2014-08-22 LAB — NEUROMYELITIS OPTICA AUTOAB, IGG

## 2014-08-22 LAB — SEDIMENTATION RATE: Sed Rate: 27 mm/hr (ref 0–40)

## 2014-08-29 ENCOUNTER — Other Ambulatory Visit: Payer: Self-pay | Admitting: Neurology

## 2014-08-29 DIAGNOSIS — H469 Unspecified optic neuritis: Secondary | ICD-10-CM

## 2014-09-05 ENCOUNTER — Ambulatory Visit
Admission: RE | Admit: 2014-09-05 | Discharge: 2014-09-05 | Disposition: A | Discharge status: Home / Self Care | Payer: BLUE CROSS/BLUE SHIELD | Source: Ambulatory Visit | Attending: Neurology | Admitting: Neurology

## 2014-09-05 ENCOUNTER — Other Ambulatory Visit: Payer: Self-pay | Admitting: Neurology

## 2014-09-05 DIAGNOSIS — H469 Unspecified optic neuritis: Secondary | ICD-10-CM

## 2014-09-05 LAB — CSF CELL COUNT WITH DIFFERENTIAL
RBC Count, CSF: 3 cu mm — ABNORMAL HIGH
TUBE #: 3
WBC, CSF: 3 cu mm (ref 0–5)

## 2014-09-05 LAB — PROTEIN, CSF: TOTAL PROTEIN, CSF: 50 mg/dL — AB (ref 15–45)

## 2014-09-05 LAB — GLUCOSE, CSF: GLUCOSE CSF: 68 mg/dL (ref 43–76)

## 2014-09-05 NOTE — Discharge Instructions (Signed)

## 2014-09-05 NOTE — Progress Notes (Signed)
1 SST tube drawn from left AC. Site is unremarkable and jpt tolerated well. Discharge instructions explained to pt.

## 2014-09-06 LAB — VDRL, CSF: SYPHILIS VDRL QUANT CSF: NONREACTIVE

## 2014-09-08 ENCOUNTER — Telehealth: Payer: Self-pay | Admitting: Neurology

## 2014-09-08 LAB — CNS IGG SYNTHESIS RATE, CSF+BLOOD
ALBUMIN, SERUM(NEPH): 3.9 g/dL (ref 3.5–4.9)
Albumin, CSF: 30.3 mg/dL (ref 8.0–42.0)
IGG, SERUM: 844 mg/dL (ref 694–1618)
IgG Index, CSF: 0.46 (ref <0.66)
IgG, CSF: 3 mg/dL (ref 0.8–7.7)
MS CNS IgG Synthesis Rate: -2.6 mg/24 h (ref <=3.3)

## 2014-09-08 NOTE — Telephone Encounter (Signed)
I have spoken with Mariah Torres this afternoon and per Dr. Brett Fairy, have advised her to increase water and caffeine intake, otc Tylenol or Ibuprofen for the next 24 hours, call back tomorrow if no better or if worse.  She verbalized understanding of same/fim

## 2014-09-08 NOTE — Telephone Encounter (Signed)
Hi, Mariah Torres, As we just discussed in person here as my advice for this patient of Mariah Torres. Please advise patient to drink a caffeinated soda or tea and to hydrate well. If she feels that her headaches get worse and not improved we will consider a blood patch tomorrow for the next 24 hours and want her to sufficiently hydrated.  She may want to take ibuprofen or Tylenol if she has soreness around the LP site.

## 2014-09-08 NOTE — Telephone Encounter (Signed)
LP on 8/26, HA started on Saturday 8/27 with progression daily. It is an 8 today. Please call and advise. Patient can be reached at [phone]

## 2014-09-10 LAB — OLIGOCLONAL BANDS, CSF + SERM

## 2014-09-23 ENCOUNTER — Telehealth: Payer: Self-pay | Admitting: Neurology

## 2014-09-23 NOTE — Telephone Encounter (Signed)
Patient called for results of Lumbar Puncture. Please call [phone].

## 2014-09-23 NOTE — Telephone Encounter (Signed)
Pt called back to speak with nurse about LP results. Phone number listed is her work number please tell the person who answers that it is the dr.s office and they should get the pt. Thank you [phone]

## 2014-09-23 NOTE — Telephone Encounter (Signed)
LMTC./fim 

## 2014-09-23 NOTE — Telephone Encounter (Signed)
It looks like her LP was done 09/05/2014. However, there are no results completed in the record. Please see if he can get the results from the lab.

## 2014-09-23 NOTE — Telephone Encounter (Signed)
I found her LP results.       Results are borderline ---   One CSF test (oligoclonal bands) was abnormal and another (IgG Index) was normal.   In situations lie this, I like to re-check MRI 6 months from prior one to see if there is change over time (if there is, MS is more likely)

## 2014-09-24 NOTE — Telephone Encounter (Signed)
I have spoken with Mariah Torres and per RAS, advised that one of the LP tests was abnormal and one was normal, that he would like to repeat MRI around the first of Feb. 2017 to check for changes, further r/o or r/i MS.  She verbalized understanding of same.  She has an appt. with RAS on 10-03-14 and will keep this appt. to discuss lab results in greater detail/fim

## 2014-10-03 ENCOUNTER — Ambulatory Visit (INDEPENDENT_AMBULATORY_CARE_PROVIDER_SITE_OTHER): Payer: BLUE CROSS/BLUE SHIELD | Admitting: Neurology

## 2014-10-03 ENCOUNTER — Encounter: Payer: Self-pay | Admitting: Neurology

## 2014-10-03 VITALS — BP 134/80 | HR 78 | Resp 6 | Ht 65.0 in | Wt 221.7 lb

## 2014-10-03 DIAGNOSIS — R930 Abnormal findings on diagnostic imaging of skull and head, not elsewhere classified: Secondary | ICD-10-CM | POA: Diagnosis not present

## 2014-10-03 DIAGNOSIS — F411 Generalized anxiety disorder: Secondary | ICD-10-CM

## 2014-10-03 DIAGNOSIS — H469 Unspecified optic neuritis: Secondary | ICD-10-CM

## 2014-10-03 DIAGNOSIS — R9089 Other abnormal findings on diagnostic imaging of central nervous system: Secondary | ICD-10-CM

## 2014-10-03 NOTE — Progress Notes (Addendum)
[facility]  PATIENT: MERCEDEZ BOULE DOB: October 30, 1955  REFERRING DOCTOR OR PCP:  Billey Chang SOURCE: patient and records from EMR and MRI images on PACS  _________________________________   HISTORICAL  CHIEF COMPLAINT:  Chief Complaint  Patient presents with  . Optic Neuritis    Sts. still does not think vision in right eye is back to normal--is not able to be very specific, just sts. "it feels like something is there, something's not right."  Denies pain.  Vision is 20/40 bilat. She is here to discuss lp results./fim    HISTORY OF PRESENT ILLNESS:  Hildegard Hlavac is a 59 year old woman with optic neuritis mid August 2016.    The MRI of the brain and orbits showed restricted diffusion of the right optic nerve, with enhancement after gadolinium was administered. This was consistent with optic neuritis. Additionally, by my review, there are about a dozen T2/FLAIR hyperintense foci. Most of these appear fairly nonspecific being round and in the deep or subcortical white matter.  A couple are periventricular.   She was admitted to the hospital and received 3 days of IV steroids.    When I saw her 08/22/2014, she felt vision ws unchanged.   However, over the last couple weeks vision has continued to improve and she now feels that she is 20/20 though she still has a slight 'looking through a veil' sensation out of the eye.  She had a lumbar puncture 09/05/2014. The CSF isis borderline showing 3 oligoclonal bands but a normal IgG index.   ANA, ESR, PAN-ANCA, NMO were all negative or normal  Late 2015, she had a few days of vertigo. She noticed a spinning sensation. Of note she also was experiencing some sinusitis around that time.s  She feels anxiety is doing better on her current medications.  She denies any difficulty with her gait, strength or sensation. She has some urinary frequency but this has been an issue for many years.  She has a positive family history of  multiple sclerosis. Specifically, her identical twin sister was diagnosed about 63.  She uses a walker. Her mother also had MS ad is bedridden.    REVIEW OF SYSTEMS: Constitutional: No fevers, chills, sweats, or change in appetite Eyes: No visual changes, double vision, eye pain Ear, nose and throat: No hearing loss, ear pain, nasal congestion, sore throat Cardiovascular: No chest pain, palpitations Respiratory: No shortness of breath at rest or with exertion.   No wheezes GastrointestinaI: No nausea, vomiting, diarrhea, abdominal pain, fecal incontinence Genitourinary: No dysuria, urinary retention or frequency.  No nocturia. Musculoskeletal: No neck pain, back pain Integumentary: No rash, pruritus, skin lesions Neurological: as above Psychiatric: No depression at this time.  No anxiety Endocrine: No palpitations, diaphoresis, change in appetite, change in weigh or increased thirst Hematologic/Lymphatic: No anemia, purpura, petechiae. Allergic/Immunologic: No itchy/runny eyes, nasal congestion, recent allergic reactions, rashes  ALLERGIES: Allergies  Allergen Reactions  . Aleve [Naproxen Sodium] Anaphylaxis  . Codeine Itching and Other (See Comments)    jittery  . Ace Inhibitors Cough    HOME MEDICATIONS:  Current outpatient prescriptions:  .  ALPRAZolam (XANAX) 0.5 MG tablet, Take 0.5 mg by mouth., Disp: , Rfl:  .  atorvastatin (LIPITOR) 40 MG tablet, Take 40 mg by mouth at bedtime., Disp: , Rfl: 0 .  Blood Glucose Monitoring Suppl DEVI, One Touch Ultra - check BS as directed, Disp: , Rfl:  .  cholecalciferol (VITAMIN D) 1000 UNITS tablet, Take 2,000 Units by  mouth daily., Disp: , Rfl:  .  escitalopram (LEXAPRO) 10 MG tablet, Take 1 tablet (10 mg total) by mouth daily., Disp: 30 tablet, Rfl: 5 .  fluticasone (FLONASE) 50 MCG/ACT nasal spray, Place 1 spray into both nostrils daily as needed for allergies or rhinitis., Disp: , Rfl:  .  glipiZIDE (GLUCOTROL XL) 10 MG 24 hr  tablet, Take 10 mg by mouth daily., Disp: , Rfl:  .  glucose blood test strip, One Touch Ultra - Use as instructed, Disp: , Rfl:  .  Lancets (ACCU-CHEK SOFT TOUCH) lancets, Use as directed., Disp: , Rfl:  .  losartan-hydrochlorothiazide (HYZAAR) 100-25 MG per tablet, Take 1 tablet by mouth daily., Disp: , Rfl: 2 .  metFORMIN (GLUCOPHAGE) 1000 MG tablet, Take 1,000 mg by mouth 2 (two) times daily with a meal., Disp: , Rfl:  .  triamcinolone ointment (KENALOG) 0.5 %, Apply 1 application topically 2 (two) times daily as needed (rash). , Disp: , Rfl:  .  diazepam (VALIUM) 5 MG tablet, Take 1 tablet (5 mg total) by mouth at bedtime as needed for anxiety. (Patient not taking: Reported on 10/03/2014), Disp: 15 tablet, Rfl: 0 .  HYDROcodone-acetaminophen (NORCO/VICODIN) 5-325 MG per tablet, Take 1 tablet by mouth every 4 (four) hours as needed for moderate pain. (Patient not taking: Reported on 10/03/2014), Disp: 10 tablet, Rfl: 0  PAST MEDICAL HISTORY: Past Medical History  Diagnosis Date  . Hypertension   . Diabetes mellitus without complication   . Obesity   . High cholesterol     PAST SURGICAL HISTORY: History reviewed. No pertinent past surgical history.  FAMILY HISTORY: History reviewed. No pertinent family history.  SOCIAL HISTORY:  Social History   Social History  . Marital Status: Divorced    Spouse Name: N/A  . Number of Children: N/A  . Years of Education: N/A   Occupational History  . Not on file.   Social History Main Topics  . Smoking status: Former Smoker -- 0.50 packs/day for 5 years    Types: Cigarettes    Quit date: 09/04/2004  . Smokeless tobacco: Never Used  . Alcohol Use: 0.0 oz/week    0 Standard drinks or equivalent per week     Comment: Rare  . Drug Use: No  . Sexual Activity: Not on file   Other Topics Concern  . Not on file   Social History Narrative     PHYSICAL EXAM  Filed Vitals:   10/03/14 1128  BP: 134/80  Pulse: 78  Resp: 6  Height:  '5\' 5"'  (1.651 m)  Weight: 221 lb 11.2 oz (100.562 kg)    Body mass index is 36.89 kg/(m^2).   General: The patient is well-developed and well-nourished and in no acute distress  Eyes:  Funduscopic exam shows normal optic discs and retinal vessels.   Neurologic Exam  Mental status: The patient is alert and oriented x 3 at the time of the examination. The patient has apparent normal recent and remote memory, with an apparently normal attention span and concentration ability.   Speech is normal.  Cranial nerves: Extraocular movements are full. She has a 2+ APD on the right. Color vision is fairly symmetric now.  Visual fields are full.  Facial symmetry is present. There is good facial sensation to soft touch bilaterally.Facial strength is normal.  Trapezius and sternocleidomastoid strength is normal. No dysarthria is noted.  The tongue is midline, and the patient has symmetric elevation of the soft palate. No obvious hearing deficits  are noted.  Motor:  Muscle bulk is normal.   Tone is normal. Strength is  5 / 5 in all 4 extremities.   Sensory: Sensory testing is intact to pinprick, soft touch and vibration sensation in all 4 extremities.  Coordination: Cerebellar testing reveals good finger-nose-finger and heel-to-shin bilaterally.  Gait and station: Station is normal.   Gait is normal but tandem gait is wide Romberg is negative.   Reflexes: Deep tendon reflexes are symmetric and normal bilaterally.       DIAGNOSTIC DATA (LABS, IMAGING, TESTING) - I reviewed patient records, labs, notes, testing and imaging myself where available.  Lab Results  Component Value Date   WBC 7.8 08/17/2014   HGB 13.8 08/17/2014   HCT 40.5 08/17/2014   MCV 87.9 08/17/2014   PLT 288 08/17/2014      Component Value Date/Time   NA 135 08/17/2014 0610   K 4.2 08/17/2014 0610   CL 99* 08/17/2014 0610   CO2 26 08/17/2014 0610   GLUCOSE 299* 08/17/2014 0610   BUN 15 08/17/2014 0610   CREATININE 0.93  08/17/2014 0610   CALCIUM 9.7 08/17/2014 0610   GFRNONAA >60 08/17/2014 0610   GFRAA >60 08/17/2014 0610   No results found for: CHOL, HDL, LDLCALC, LDLDIRECT, TRIG, CHOLHDL Lab Results  Component Value Date   HGBA1C 7.3* 08/17/2014       ASSESSMENT AND PLAN  Optic neuritis  Abnormal finding on MRI of brain  Anxiety state    Shaquanna Lycan is a 59 year old woman with right optic neuritis and a strong family history of multiple sclerosis. Her MRI of the brain is abnormal this pattern is nonspecific. Her CSF was borderline abnormal with 3 oligoclonal bands and normal IgG index.     I had a long discussion with her and her daughter that the history and findings are certainly worrisome for multiple sclerosis though not more than 64% certain of the diagnosis. Before placing her on medications that potentially could have side effects I would like to be more certain of the diagnosis. Therefore, we will recheck an MRI of the brain in about another 4 - 5 months.  If there is progression in that period of time, then MS is more likely and we will strongly consider starting a disease modifying therapy. We discussed the time course of MS symptoms and relapses and what symptoms should prompt a call to [place].  She will return in 4 months or sooner if there are new or worsening neurologic symptoms.  30 minute face-to-face office visit with greater than one half of the time counseling and coordinating care about possibility of MS, prognostic indicators, symptoms of MS.    Richard A. Felecia Shelling, MD, PhD 6/80/3212, 24:82 AM Certified in Neurology, Clinical Neurophysiology, Sleep Medicine, Pain Medicine and Neuroimaging  [facility] [address], [place], [place] [address] [phone]

## 2015-02-05 ENCOUNTER — Ambulatory Visit: Payer: BLUE CROSS/BLUE SHIELD | Admitting: Neurology

## 2015-02-07 ENCOUNTER — Other Ambulatory Visit: Payer: Self-pay | Admitting: Neurology

## 2015-03-06 ENCOUNTER — Encounter: Payer: Self-pay | Admitting: Neurology

## 2015-03-06 ENCOUNTER — Ambulatory Visit (INDEPENDENT_AMBULATORY_CARE_PROVIDER_SITE_OTHER): Payer: BLUE CROSS/BLUE SHIELD | Admitting: Neurology

## 2015-03-06 ENCOUNTER — Telehealth: Payer: Self-pay | Admitting: Neurology

## 2015-03-06 VITALS — BP 112/76 | HR 80 | Resp 16 | Ht 65.0 in | Wt 220.0 lb

## 2015-03-06 DIAGNOSIS — M542 Cervicalgia: Secondary | ICD-10-CM | POA: Diagnosis not present

## 2015-03-06 DIAGNOSIS — G4489 Other headache syndrome: Secondary | ICD-10-CM

## 2015-03-06 DIAGNOSIS — H8110 Benign paroxysmal vertigo, unspecified ear: Secondary | ICD-10-CM | POA: Insufficient documentation

## 2015-03-06 DIAGNOSIS — H469 Unspecified optic neuritis: Secondary | ICD-10-CM

## 2015-03-06 DIAGNOSIS — F411 Generalized anxiety disorder: Secondary | ICD-10-CM | POA: Diagnosis not present

## 2015-03-06 DIAGNOSIS — H8111 Benign paroxysmal vertigo, right ear: Secondary | ICD-10-CM | POA: Diagnosis not present

## 2015-03-06 NOTE — Progress Notes (Signed)
[facility]  PATIENT: Mariah Torres DOB: 11/02/1955  REFERRING DOCTOR OR PCP:  Billey Chang SOURCE: patient and records from EMR and MRI images on PACS  _________________________________   HISTORICAL  CHIEF COMPLAINT:  Chief Complaint  Patient presents with  . History of Optic Neuritis    Sts. she feels vision in right eye is back to baseline, but still has light sensitivity right eye only.   Dr. Felecia Shelling planned to repeat her MRI brain to assess for further changes.  Sts. she has had throbbing intermittent h/a for the last 2 weeks.  Soreness right occipital region, esp. with lying on that side at night. Also c/o intermittent vertigo with lying down at night, onset 3-4 weeks ago./fim    HISTORY OF PRESENT ILLNESS:  Mariah Torres is a 60 year old woman with optic neuritis mid August 2016.    At that time, MRI of the brain and orbits showed restricted diffusion of the right optic nerve, with enhancement after gadolinium was administered. This was consistent with optic neuritis. I looked again at the MRI images.    There are about a dozen T2/FLAIR hyperintense foci. Most of these appear fairly nonspecific being round and in the deep or subcortical white matter.  A couple are periventricular.   She was admitted to the hospital and received 3 days of IV steroids.    Other studies:   She had a lumbar puncture 09/05/2014. The CSF is borderline with 3 OCB and normal IgG index.     Serologic studies were normal.     Vision improved over the next 6-8 weeks.   She feels there is a little veil over the right eye but she is back to 20/20 and colors not desaturated.    She has had vertigo the last month.   She notes it only at night right after she lays down and if she rolls over.    This does not bother her during the day.  In late 2015, she also had a few days of vertigo.   Anxiety//depression:  She feels anxiety is a little better on Lexapro and depression is better.  She  denies any difficulty with her gait, strength or sensation. She has some urinary frequency but this has been an issue for many years.  She has a positive family history of multiple sclerosis. Specifically, her identical twin sister was diagnosed about 100.  She uses a walker. Her mother also had MS ad is bedridden.    REVIEW OF SYSTEMS: Constitutional: No fevers, chills, sweats, or change in appetite Eyes: No visual changes, double vision, eye pain Ear, nose and throat: No hearing loss, ear pain, nasal congestion, sore throat Cardiovascular: No chest pain, palpitations Respiratory: No shortness of breath at rest or with exertion.   No wheezes GastrointestinaI: No nausea, vomiting, diarrhea, abdominal pain, fecal incontinence Genitourinary: No dysuria, urinary retention or frequency.  No nocturia. Musculoskeletal: No neck pain, back pain Integumentary: No rash, pruritus, skin lesions Neurological: as above Psychiatric: No depression at this time.  No anxiety Endocrine: No palpitations, diaphoresis, change in appetite, change in weigh or increased thirst Hematologic/Lymphatic: No anemia, purpura, petechiae. Allergic/Immunologic: No itchy/runny eyes, nasal congestion, recent allergic reactions, rashes  ALLERGIES: Allergies  Allergen Reactions  . Aleve [Naproxen Sodium] Anaphylaxis  . Codeine Itching and Other (See Comments)    jittery  . Ace Inhibitors Cough    HOME MEDICATIONS:  Current outpatient prescriptions:  .  ALPRAZolam (XANAX) 0.5 MG tablet, Take 0.5 mg  by mouth., Disp: , Rfl:  .  atorvastatin (LIPITOR) 40 MG tablet, Take 40 mg by mouth at bedtime., Disp: , Rfl: 0 .  Blood Glucose Monitoring Suppl DEVI, One Touch Ultra - check BS as directed, Disp: , Rfl:  .  cholecalciferol (VITAMIN D) 1000 UNITS tablet, Take 2,000 Units by mouth daily., Disp: , Rfl:  .  escitalopram (LEXAPRO) 10 MG tablet, TAKE 1 TABLET (10 MG TOTAL) BY MOUTH DAILY., Disp: 30 tablet, Rfl: 5 .   fluticasone (FLONASE) 50 MCG/ACT nasal spray, Place 1 spray into both nostrils daily as needed for allergies or rhinitis., Disp: , Rfl:  .  glucose blood test strip, One Touch Ultra - Use as instructed, Disp: , Rfl:  .  Lancets (ACCU-CHEK SOFT TOUCH) lancets, Use as directed., Disp: , Rfl:  .  losartan-hydrochlorothiazide (HYZAAR) 100-25 MG per tablet, Take 1 tablet by mouth daily., Disp: , Rfl: 2 .  metFORMIN (GLUCOPHAGE) 1000 MG tablet, Take 1,000 mg by mouth 2 (two) times daily with a meal., Disp: , Rfl:   PAST MEDICAL HISTORY: Past Medical History  Diagnosis Date  . Hypertension   . Diabetes mellitus without complication ([place])   . Obesity   . High cholesterol     PAST SURGICAL HISTORY: History reviewed. No pertinent past surgical history.  FAMILY HISTORY: History reviewed. No pertinent family history.  SOCIAL HISTORY:  Social History   Social History  . Marital Status: Divorced    Spouse Name: N/A  . Number of Children: N/A  . Years of Education: N/A   Occupational History  . Not on file.   Social History Main Topics  . Smoking status: Former Smoker -- 0.50 packs/day for 5 years    Types: Cigarettes    Quit date: 09/04/2004  . Smokeless tobacco: Never Used  . Alcohol Use: 0.0 oz/week    0 Standard drinks or equivalent per week     Comment: Rare  . Drug Use: No  . Sexual Activity: Not on file   Other Topics Concern  . Not on file   Social History Narrative     PHYSICAL EXAM  Filed Vitals:   03/06/15 0953  BP: 112/76  Pulse: 80  Resp: 16  Height: 5\' 5"  (1.651 m)  Weight: 220 lb (99.791 kg)    Body mass index is 36.61 kg/(m^2).   General: The patient is well-developed and well-nourished and in no acute distress.   She is tender over the right occiput.    Neurologic Exam  Mental status: The patient is alert and oriented x 3 at the time of the examination. The patient has apparent normal recent and remote memory, with an apparently normal attention  span and concentration ability.   Speech is normal.  Cranial nerves: Extraocular movements are full. . Color vision is fairly symmetric now.  Visual fields are full.  Facial symmetry is present. There is good facial sensation to soft touch bilaterally.Facial strength is normal.  Trapezius and sternocleidomastoid strength is normal. No dysarthria is noted.  The tongue is midline, and the patient has symmetric elevation of the soft palate. No obvious hearing deficits are noted.  Motor:  Muscle bulk is normal.   Tone is normal. Strength is  5 / 5 in all 4 extremities.   Sensory: Sensory testing is intact to pinprick, soft touch and vibration sensation in all 4 extremities.  Coordination: Cerebellar testing reveals good finger-nose-finger and heel-to-shin bilaterally.  Gait and station: Station is normal.   Gait is  normal but tandem gait is wide Romberg is negative.   Reflexes: Deep tendon reflexes are symmetric and normal bilaterally.     Other:  Gae Bon testing to the right evokes vertigo and mild rotatory nystagmus x 20-30 seconds    DIAGNOSTIC DATA (LABS, IMAGING, TESTING) - I reviewed patient records, labs, notes, testing and imaging myself where available.  Lab Results  Component Value Date   WBC 7.8 08/17/2014   HGB 13.8 08/17/2014   HCT 40.5 08/17/2014   MCV 87.9 08/17/2014   PLT 288 08/17/2014      Component Value Date/Time   NA 135 08/17/2014 0610   K 4.2 08/17/2014 0610   CL 99* 08/17/2014 0610   CO2 26 08/17/2014 0610   GLUCOSE 299* 08/17/2014 0610   BUN 15 08/17/2014 0610   CREATININE 0.93 08/17/2014 0610   CALCIUM 9.7 08/17/2014 0610   GFRNONAA >60 08/17/2014 0610   GFRAA >60 08/17/2014 0610   No results found for: CHOL, HDL, LDLCALC, LDLDIRECT, TRIG, CHOLHDL Lab Results  Component Value Date   HGBA1C 7.3* 08/17/2014       ASSESSMENT AND PLAN  Optic neuritis  Benign paroxysmal positional vertigo, right  Other headache syndrome  Neck pain  Anxiety  state    1.   Recheck an MRI of the brain.   If there is progression in that period of time, then MS is more likely and we will strongly consider starting a disease modifying therapy. We discussed the time course of MS symptoms and relapses and what symptoms should prompt a call to [place]. 2.   Epley maneuver starting with right ear down.   I taught her how to do so she can do daily until vertigo resolves 3.   Right splenius capitis TPI with 80 mg Depo-Medrol in [place] using sterile technique.   She tolerated the procedure well and there were no complications  She will return in 4 months or sooner if there are new or worsening neurologic symptoms.    Elfrieda Espino A. Felecia Shelling, MD, PhD [id number], [id number] Certified in Neurology, Clinical Neurophysiology, Sleep Medicine, Pain Medicine and Neuroimaging  [facility] [address], [place], [place] [address] [phone]

## 2015-03-06 NOTE — Telephone Encounter (Signed)
Called pt to make a 4 month f/u because she didn't stop by at [employer].

## 2015-03-18 ENCOUNTER — Telehealth: Payer: Self-pay | Admitting: Neurology

## 2015-03-18 MED ORDER — ALPRAZOLAM 0.5 MG PO TABS: ORAL_TABLET | ORAL | Status: AC

## 2015-03-18 NOTE — Telephone Encounter (Signed)
Rx. faxed to [facility] as requested/fim 

## 2015-03-18 NOTE — Telephone Encounter (Signed)
Rx. awaiting RAS sig/fim 

## 2015-03-18 NOTE — Telephone Encounter (Signed)
Patient is calling. She is schedules for an MRI on 03-23-15 and needs medication called in to calm her down. Please call to [facility] on [address].

## 2015-03-23 ENCOUNTER — Ambulatory Visit
Admission: RE | Admit: 2015-03-23 | Discharge: 2015-03-23 | Disposition: A | Discharge status: Home / Self Care | Payer: BLUE CROSS/BLUE SHIELD | Source: Ambulatory Visit | Attending: Neurology | Admitting: Neurology

## 2015-03-23 DIAGNOSIS — G4489 Other headache syndrome: Secondary | ICD-10-CM | POA: Diagnosis not present

## 2015-03-23 DIAGNOSIS — H469 Unspecified optic neuritis: Secondary | ICD-10-CM

## 2015-03-23 MED ORDER — GADOBENATE DIMEGLUMINE 529 MG/ML IV SOLN
20.0000 mL | Freq: Once | INTRAVENOUS | Status: AC | PRN
  Administered 2015-03-23: 20 mL via INTRAVENOUS

## 2015-03-24 ENCOUNTER — Telehealth: Payer: Self-pay | Admitting: *Deleted

## 2015-03-24 NOTE — Telephone Encounter (Signed)
-----   Message from Britt Bottom, MD sent at 03/24/2015  9:17 AM EDT ----- Please let her know that the MRI looks good. There is nothing new since the last MRI. That makes MS much less likely.

## 2015-03-24 NOTE — Telephone Encounter (Signed)
LMTC./fim 

## 2015-03-25 ENCOUNTER — Telehealth: Payer: Self-pay | Admitting: *Deleted

## 2015-03-25 NOTE — Telephone Encounter (Signed)
-----   Message from Britt Bottom, MD sent at 03/24/2015  9:17 AM EDT ----- Please let her know that the MRI looks good. There is nothing new since the last MRI. That makes MS much less likely.

## 2015-03-25 NOTE — Telephone Encounter (Signed)
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 Thank you

## 2015-03-25 NOTE — Telephone Encounter (Signed)
I have spoken with Mariah Torres this afternoon and per RAS, advised that mri looks good--nothing new since last mri, so this makes MS much less likely.  She verbalized understanding of same/fim

## 2015-03-25 NOTE — Telephone Encounter (Signed)
I attempted to contact pt. at work #.  This # is a general #, gives options to be transferred to depts or a dial by name directory.  Directory didn't recognize Christee's name.  I lmom at home #/fim

## 2015-03-25 NOTE — Telephone Encounter (Signed)
Pt returned call . Please call her work #

## 2015-06-12 ENCOUNTER — Telehealth: Payer: Self-pay | Admitting: *Deleted

## 2015-06-12 NOTE — Telephone Encounter (Signed)
[place].  She has an appt. with RAS on 07-08-15 at [facility].  RAS will not be in the office until 0920 that day.   I need to r/s this appt. for her/fim

## 2015-07-08 ENCOUNTER — Ambulatory Visit: Payer: BLUE CROSS/BLUE SHIELD | Admitting: Neurology

## 2015-08-14 ENCOUNTER — Ambulatory Visit (INDEPENDENT_AMBULATORY_CARE_PROVIDER_SITE_OTHER): Payer: BLUE CROSS/BLUE SHIELD | Admitting: Neurology

## 2015-08-14 ENCOUNTER — Encounter: Payer: Self-pay | Admitting: Neurology

## 2015-08-14 VITALS — BP 129/80 | HR 75 | Ht 65.0 in | Wt 212.0 lb

## 2015-08-14 DIAGNOSIS — R930 Abnormal findings on diagnostic imaging of skull and head, not elsewhere classified: Secondary | ICD-10-CM

## 2015-08-14 DIAGNOSIS — G4489 Other headache syndrome: Secondary | ICD-10-CM

## 2015-08-14 DIAGNOSIS — Z820 Family history of epilepsy and other diseases of the nervous system: Secondary | ICD-10-CM

## 2015-08-14 DIAGNOSIS — H469 Unspecified optic neuritis: Secondary | ICD-10-CM

## 2015-08-14 DIAGNOSIS — R9089 Other abnormal findings on diagnostic imaging of central nervous system: Secondary | ICD-10-CM

## 2015-08-14 NOTE — Progress Notes (Signed)
[facility]  PATIENT: Mariah Torres DOB: [birth year]  REFERRING DOCTOR OR PCP:  Billey Chang SOURCE: patient and records from EMR and MRI images on PACS  _________________________________   HISTORICAL  CHIEF COMPLAINT:  Chief Complaint  Patient presents with  . Follow-up  . R optic neuritis    No pain, ust funny feeling  . BPPV    had one episode, did exercises and went away  . headaches    come and go.     HISTORY OF PRESENT ILLNESS:  Mariah Torres is a 60 year old woman with optic neuritis mid August 2016.    At that time, MRI of the brain and orbits showed restricted diffusion of the right optic nerve, with enhancement after gadolinium was administered. This was consistent with optic neuritis. I looked again at the MRI images.    There are about a dozen T2/FLAIR hyperintense foci. Most of these appear fairly nonspecific being round and in the deep or subcortical white matter.  A couple are periventricular.   She was admitted to the hospital and received 3 days of IV steroids.     She had a lumbar puncture 09/05/2014. The CSF is borderline with 3 OCB and normal IgG index.     Serologic studies were normal.     Vision:     She feels visual disturbance has completely resolved to baseline.    Colors are symmetric.   No eye pain.   No diplopia.  Headache:   She gets headaches occasionally, more the past 2 weeks than usual.       Advil knocks them out easily now.   The severe occipital headaches resolved with the injections last visit.    Vertigo:    She had vertigo x a couple days a couple months ago that resolved after [employer] exercises.      No change in hearing.   No veering with walking.     Anxiety//depression:  She feels anxiety is a little better on Lexapro and depression is better.   Her mother died (had MS) and job is tressful  Other Neuro:   She denies any difficulty with her gait, strength or sensation. She has some urinary frequency but this  has been an issue for many years.  She has a positive family history of multiple sclerosis. Specifically, her identical twin sister was diagnosed about 31. Uses walker/wheelchair (is on Copaxone).   Her mother had MS and became bedridden and recently died.      REVIEW OF SYSTEMS: Constitutional: No fevers, chills, sweats, or change in appetite Eyes: No visual changes, double vision, eye pain Ear, nose and throat: No hearing loss, ear pain, nasal congestion, sore throat Cardiovascular: No chest pain, palpitations Respiratory: No shortness of breath at rest or with exertion.   No wheezes GastrointestinaI: No nausea, vomiting, diarrhea, abdominal pain, fecal incontinence Genitourinary: No dysuria, urinary retention or frequency.  No nocturia. Musculoskeletal: No neck pain, back pain Integumentary: No rash, pruritus, skin lesions Neurological: as above Psychiatric: No depression at this time.  No anxiety Endocrine: No palpitations, diaphoresis, change in appetite, change in weigh or increased thirst Hematologic/Lymphatic: No anemia, purpura, petechiae. Allergic/Immunologic: No itchy/runny eyes, nasal congestion, recent allergic reactions, rashes  ALLERGIES: Allergies  Allergen Reactions  . Aleve [Naproxen Sodium] Anaphylaxis  . Codeine Itching and Other (See Comments)    jittery  . Penicillins   . Ace Inhibitors Cough    HOME MEDICATIONS:  Current Outpatient Prescriptions:  .  ALPRAZolam (XANAX) 0.5 MG tablet, Take one tablet 30 min. piror to MRI.  May repeat once if needed.  May not drink alcohol, drive or operate dangerous equipment while taking., Disp: 2 tablet, Rfl: 0 .  atorvastatin (LIPITOR) 40 MG tablet, Take 40 mg by mouth at bedtime., Disp: , Rfl: 0 .  Blood Glucose Monitoring Suppl DEVI, One Touch Ultra - check BS as directed, Disp: , Rfl:  .  cholecalciferol (VITAMIN D) 1000 UNITS tablet, Take 2,000 Units by mouth daily., Disp: , Rfl:  .  escitalopram (LEXAPRO) 20  MG tablet, Take 20 mg by mouth daily., Disp: , Rfl:  .  fluticasone (FLONASE) 50 MCG/ACT nasal spray, Place 1 spray into both nostrils daily as needed for allergies or rhinitis., Disp: , Rfl:  .  glucose blood test strip, One Touch Ultra - Use as instructed, Disp: , Rfl:  .  Lancets (ACCU-CHEK SOFT TOUCH) lancets, Use as directed., Disp: , Rfl:  .  losartan-hydrochlorothiazide (HYZAAR) 100-25 MG per tablet, Take 1 tablet by mouth daily., Disp: , Rfl: 2 .  metFORMIN (GLUCOPHAGE) 1000 MG tablet, Take 1,000 mg by mouth 2 (two) times daily with a meal., Disp: , Rfl:   PAST MEDICAL HISTORY: Past Medical History:  Diagnosis Date  . Diabetes mellitus without complication ([place])   . High cholesterol   . Hypertension   . Obesity     PAST SURGICAL HISTORY: History reviewed. No pertinent surgical history.  FAMILY HISTORY: History reviewed. No pertinent family history.  SOCIAL HISTORY:  Social History   Social History  . Marital status: Divorced    Spouse name: N/A  . Number of children: N/A  . Years of education: N/A   Occupational History  . Not on file.   Social History Main Topics  . Smoking status: Former Smoker    Packs/day: 0.50    Years: 5.00    Types: Cigarettes    Quit date: 09/04/2004  . Smokeless tobacco: Never Used  . Alcohol use 0.0 oz/week     Comment: Rare  . Drug use: No  . Sexual activity: Not on file   Other Topics Concern  . Not on file   Social History Narrative  . No narrative on file     PHYSICAL EXAM  Vitals:   08/14/15 0823  BP: 129/80  Pulse: 75  Weight: 212 lb (96.2 kg)  Height: 5\' 5"  (1.651 m)    Body mass index is 35.28 kg/m.   General: The patient is well-developed and well-nourished and in no acute distress.   Now nontender at occiput.  Neurologic Exam  Mental status: The patient is alert and oriented x 3 at the time of the examination. The patient has apparent normal recent and remote memory, with an apparently normal  attention span and concentration ability.   Speech is normal.  Cranial nerves: Extraocular movements are full. . Color vision is symmetric.  Visual fields are full.  Facial symmetry is present. There is good facial sensation to soft touch bilaterally.Facial strength is normal.  Trapezius and sternocleidomastoid strength is normal. No dysarthria is noted.  The tongue is midline, and the patient has symmetric elevation of the soft palate. No obvious hearing deficits are noted.  Motor:  Muscle bulk is normal.   Tone is normal. Strength is  5 / 5 in all 4 extremities.   Sensory: Sensory testing is intact to pinprick, soft touch and vibration sensation in all 4 extremities.  Coordination: Cerebellar testing reveals good finger-nose-finger  and heel-to-shin bilaterally.  Gait and station: Station is normal.   Gait is normal but tandem gait is wide Romberg is negative.   Reflexes: Deep tendon reflexes are symmetric and normal bilaterally.     Other:  Gae Bon testing to the right evokes vertigo and mild rotatory nystagmus x 20-30 seconds    DIAGNOSTIC DATA (LABS, IMAGING, TESTING) - I reviewed patient records, labs, notes, testing and imaging myself where available.  Lab Results  Component Value Date   WBC 7.8 08/17/2014   HGB 13.8 08/17/2014   HCT 40.5 08/17/2014   MCV 87.9 08/17/2014   PLT 288 08/17/2014      Component Value Date/Time   NA 135 08/17/2014 0610   K 4.2 08/17/2014 0610   CL 99 (L) 08/17/2014 0610   CO2 26 08/17/2014 0610   GLUCOSE 299 (H) 08/17/2014 0610   BUN 15 08/17/2014 0610   CREATININE 0.93 08/17/2014 0610   CALCIUM 9.7 08/17/2014 0610   GFRNONAA >60 08/17/2014 0610   GFRAA >60 08/17/2014 0610   No results found for: CHOL, HDL, LDLCALC, LDLDIRECT, TRIG, CHOLHDL Lab Results  Component Value Date   HGBA1C 7.3 (H) 08/17/2014       ASSESSMENT AND PLAN  Optic neuritis  Other headache syndrome  Abnormal finding on MRI of brain  Family history of  multiple sclerosis    1.  We discussed that MS is still a possibility. She had optic neuritis and she has a very strong family history including an identical twin with MS. If one identical twin has MS, the other one has a 25% chance of MS and this would be expected to be higher with her optic neuritis. However, the changes in her brain appeared to be more consistent with chronic microvascular ischemic change. We will need to recheck an MRI next year to see if there are changes consistent with MS. If this occurs, I would recommend adding a disease modifying therapy. 2.   Stay active.    If another vertigo episode occurs and is not helped by exercises, she will call.   3.  If headache returns similar to the previous headache, consider another occipital nerve block/trigger point.  She will return in 7 months or sooner if there are new or worsening neurologic symptoms.    Vickey Boak A. Felecia Shelling, MD, PhD [id number], 0000000 PM Certified in Neurology, Clinical Neurophysiology, Sleep Medicine, Pain Medicine and Neuroimaging  [facility] [address] [place], [place] [address] [phone]

## 2015-12-10 ENCOUNTER — Emergency Department (HOSPITAL_COMMUNITY): Payer: BLUE CROSS/BLUE SHIELD

## 2015-12-10 ENCOUNTER — Encounter (HOSPITAL_COMMUNITY): Payer: Self-pay

## 2015-12-10 ENCOUNTER — Observation Stay (HOSPITAL_COMMUNITY)
Admission: EM | Admit: 2015-12-10 | Discharge: 2015-12-11 | Disposition: A | Discharge status: Home / Self Care | Payer: BLUE CROSS/BLUE SHIELD | Attending: Internal Medicine | Admitting: Internal Medicine

## 2015-12-10 DIAGNOSIS — M546 Pain in thoracic spine: Secondary | ICD-10-CM | POA: Diagnosis not present

## 2015-12-10 DIAGNOSIS — E7800 Pure hypercholesterolemia, unspecified: Secondary | ICD-10-CM | POA: Insufficient documentation

## 2015-12-10 DIAGNOSIS — Z79899 Other long term (current) drug therapy: Secondary | ICD-10-CM | POA: Diagnosis not present

## 2015-12-10 DIAGNOSIS — R7989 Other specified abnormal findings of blood chemistry: Secondary | ICD-10-CM | POA: Diagnosis present

## 2015-12-10 DIAGNOSIS — I10 Essential (primary) hypertension: Secondary | ICD-10-CM | POA: Diagnosis present

## 2015-12-10 DIAGNOSIS — R079 Chest pain, unspecified: Principal | ICD-10-CM | POA: Insufficient documentation

## 2015-12-10 DIAGNOSIS — E785 Hyperlipidemia, unspecified: Secondary | ICD-10-CM | POA: Diagnosis not present

## 2015-12-10 DIAGNOSIS — Z96653 Presence of artificial knee joint, bilateral: Secondary | ICD-10-CM | POA: Insufficient documentation

## 2015-12-10 DIAGNOSIS — Z7982 Long term (current) use of aspirin: Secondary | ICD-10-CM | POA: Diagnosis not present

## 2015-12-10 DIAGNOSIS — E876 Hypokalemia: Secondary | ICD-10-CM | POA: Diagnosis not present

## 2015-12-10 DIAGNOSIS — R945 Abnormal results of liver function studies: Secondary | ICD-10-CM | POA: Diagnosis not present

## 2015-12-10 DIAGNOSIS — F411 Generalized anxiety disorder: Secondary | ICD-10-CM | POA: Insufficient documentation

## 2015-12-10 DIAGNOSIS — Z85820 Personal history of malignant melanoma of skin: Secondary | ICD-10-CM | POA: Diagnosis not present

## 2015-12-10 DIAGNOSIS — J302 Other seasonal allergic rhinitis: Secondary | ICD-10-CM | POA: Diagnosis not present

## 2015-12-10 DIAGNOSIS — Z7984 Long term (current) use of oral hypoglycemic drugs: Secondary | ICD-10-CM | POA: Diagnosis not present

## 2015-12-10 DIAGNOSIS — Z6835 Body mass index (BMI) 35.0-35.9, adult: Secondary | ICD-10-CM | POA: Diagnosis not present

## 2015-12-10 DIAGNOSIS — Z87891 Personal history of nicotine dependence: Secondary | ICD-10-CM | POA: Insufficient documentation

## 2015-12-10 DIAGNOSIS — E669 Obesity, unspecified: Secondary | ICD-10-CM | POA: Insufficient documentation

## 2015-12-10 DIAGNOSIS — E119 Type 2 diabetes mellitus without complications: Secondary | ICD-10-CM | POA: Diagnosis not present

## 2015-12-10 DIAGNOSIS — J45909 Unspecified asthma, uncomplicated: Secondary | ICD-10-CM | POA: Diagnosis not present

## 2015-12-10 DIAGNOSIS — F419 Anxiety disorder, unspecified: Secondary | ICD-10-CM | POA: Insufficient documentation

## 2015-12-10 HISTORY — DX: Depression, unspecified: F32.A

## 2015-12-10 HISTORY — DX: Malignant melanoma of skin, unspecified: C43.9

## 2015-12-10 HISTORY — DX: Major depressive disorder, single episode, unspecified: F32.9

## 2015-12-10 HISTORY — DX: Unspecified asthma, uncomplicated: J45.909

## 2015-12-10 HISTORY — DX: Anxiety disorder, unspecified: F41.9

## 2015-12-10 LAB — BASIC METABOLIC PANEL
ANION GAP: 12 (ref 5–15)
BUN: 17 mg/dL (ref 6–20)
CHLORIDE: 100 mmol/L — AB (ref 101–111)
CO2: 25 mmol/L (ref 22–32)
Calcium: 10.3 mg/dL (ref 8.9–10.3)
Creatinine, Ser: 0.85 mg/dL (ref 0.44–1.00)
GFR calc Af Amer: >60 mL/min (ref >60)
GFR calc non Af Amer: >60 mL/min (ref >60)
GLUCOSE: 132 mg/dL — AB (ref 65–99)
POTASSIUM: 3 mmol/L — AB (ref 3.5–5.1)
Sodium: 137 mmol/L (ref 135–145)

## 2015-12-10 LAB — CBC
HEMATOCRIT: 40.8 % (ref 36.0–46.0)
HEMOGLOBIN: 14.4 g/dL (ref 12.0–15.0)
MCH: 29.8 pg (ref 26.0–34.0)
MCHC: 35.3 g/dL (ref 30.0–36.0)
MCV: 84.5 fL (ref 78.0–100.0)
Platelets: 252 10*3/uL (ref 150–400)
RBC: 4.83 MIL/uL (ref 3.87–5.11)
RDW: 13.4 % (ref 11.5–15.5)
WBC: 9.7 10*3/uL (ref 4.0–10.5)

## 2015-12-10 LAB — TROPONIN I: Troponin I: <0.03 ng/mL (ref <0.03)

## 2015-12-10 LAB — MAGNESIUM: Magnesium: 1.6 mg/dL — ABNORMAL LOW (ref 1.7–2.4)

## 2015-12-10 LAB — I-STAT TROPONIN, ED
TROPONIN I, POC: 0 ng/mL (ref 0.00–0.08)
Troponin i, poc: 0 ng/mL (ref 0.00–0.08)

## 2015-12-10 LAB — HEPATIC FUNCTION PANEL
ALBUMIN: 4.5 g/dL (ref 3.5–5.0)
ALT: 57 U/L — AB (ref 14–54)
AST: 55 U/L — AB (ref 15–41)
Alkaline Phosphatase: 65 U/L (ref 38–126)
BILIRUBIN INDIRECT: 0.9 mg/dL (ref 0.3–0.9)
Bilirubin, Direct: 0.2 mg/dL (ref 0.1–0.5)
TOTAL PROTEIN: 7.8 g/dL (ref 6.5–8.1)
Total Bilirubin: 1.1 mg/dL (ref 0.3–1.2)

## 2015-12-10 LAB — LIPASE, BLOOD: LIPASE: 51 U/L (ref 11–51)

## 2015-12-10 MED ORDER — ENOXAPARIN SODIUM 40 MG/0.4ML ~~LOC~~ SOLN: 40.0000 mg | Freq: Every day | SUBCUTANEOUS | Status: DC

## 2015-12-10 MED ORDER — ASPIRIN EC 81 MG PO TBEC: 81.0000 mg | DELAYED_RELEASE_TABLET | Freq: Every day | ORAL | Status: DC

## 2015-12-10 MED ORDER — LOSARTAN POTASSIUM-HCTZ 100-25 MG PO TABS: 1.0000 | ORAL_TABLET | Freq: Every day | ORAL | Status: DC

## 2015-12-10 MED ORDER — ATORVASTATIN CALCIUM 40 MG PO TABS: 40.0000 mg | ORAL_TABLET | Freq: Every day | ORAL | Status: DC

## 2015-12-10 MED ORDER — ESCITALOPRAM OXALATE 20 MG PO TABS
20.0000 mg | ORAL_TABLET | Freq: Every day | ORAL | Status: DC
  Administered 2015-12-11: 20 mg via ORAL
  Filled 2015-12-10: qty 1

## 2015-12-10 MED ORDER — FENTANYL CITRATE (PF) 100 MCG/2ML IJ SOLN
50.0000 ug | Freq: Once | INTRAMUSCULAR | Status: AC
  Administered 2015-12-10: 50 ug via INTRAVENOUS
  Filled 2015-12-10: qty 2

## 2015-12-10 MED ORDER — LOSARTAN POTASSIUM 50 MG PO TABS: 100.0000 mg | ORAL_TABLET | Freq: Every day | ORAL | Status: DC

## 2015-12-10 MED ORDER — ASPIRIN EC 81 MG PO TBEC
81.0000 mg | DELAYED_RELEASE_TABLET | Freq: Every day | ORAL | Status: DC
  Administered 2015-12-11: 81 mg via ORAL
  Filled 2015-12-10: qty 1

## 2015-12-10 MED ORDER — IOPAMIDOL (ISOVUE-370) INJECTION 76%
100.0000 mL | Freq: Once | INTRAVENOUS | Status: AC | PRN
  Administered 2015-12-10: 100 mL via INTRAVENOUS

## 2015-12-10 MED ORDER — MAGNESIUM SULFATE 4 GM/100ML IV SOLN
4.0000 g | Freq: Once | INTRAVENOUS | Status: AC
  Administered 2015-12-10: 4 g via INTRAVENOUS
  Filled 2015-12-10: qty 100

## 2015-12-10 MED ORDER — VITAMIN D3 25 MCG (1000 UNIT) PO TABS
2000.0000 [IU] | ORAL_TABLET | Freq: Every day | ORAL | Status: DC
  Administered 2015-12-11: 2000 [IU] via ORAL
  Filled 2015-12-10: qty 2

## 2015-12-10 MED ORDER — HYDROCODONE-ACETAMINOPHEN 5-325 MG PO TABS
1.0000 | ORAL_TABLET | Freq: Once | ORAL | Status: AC
Start: 2015-12-10 — End: 2015-12-10
  Administered 2015-12-10: 1 via ORAL
  Filled 2015-12-10: qty 1

## 2015-12-10 MED ORDER — ONDANSETRON HCL 4 MG/2ML IJ SOLN: 4.0000 mg | Freq: Four times a day (QID) | INTRAMUSCULAR | Status: DC | PRN

## 2015-12-10 MED ORDER — NITROGLYCERIN 0.4 MG SL SUBL: 0.4000 mg | SUBLINGUAL_TABLET | SUBLINGUAL | Status: DC | PRN

## 2015-12-10 MED ORDER — IBUPROFEN 800 MG PO TABS
800.0000 mg | ORAL_TABLET | Freq: Three times a day (TID) | ORAL | Status: DC
  Administered 2015-12-10 – 2015-12-11 (×3): 800 mg via ORAL
  Filled 2015-12-10 (×3): qty 1

## 2015-12-10 MED ORDER — LORATADINE 10 MG PO TABS
10.0000 mg | ORAL_TABLET | Freq: Every day | ORAL | Status: DC
  Administered 2015-12-11: 10 mg via ORAL
  Filled 2015-12-10: qty 1

## 2015-12-10 MED ORDER — POTASSIUM CHLORIDE CRYS ER 20 MEQ PO TBCR
40.0000 meq | EXTENDED_RELEASE_TABLET | ORAL | Status: AC
  Administered 2015-12-10 (×2): 40 meq via ORAL
  Filled 2015-12-10 (×2): qty 2

## 2015-12-10 MED ORDER — HYDROCHLOROTHIAZIDE 25 MG PO TABS: 25.0000 mg | ORAL_TABLET | Freq: Every day | ORAL | Status: DC

## 2015-12-10 MED ORDER — INSULIN ASPART 100 UNIT/ML ~~LOC~~ SOLN
0.0000 [IU] | Freq: Three times a day (TID) | SUBCUTANEOUS | Status: DC
  Administered 2015-12-11 (×2): 3 [IU] via SUBCUTANEOUS

## 2015-12-10 MED ORDER — SODIUM CHLORIDE 0.9 % IV SOLN
INTRAVENOUS | Status: DC
  Administered 2015-12-10: 23:00:00 via INTRAVENOUS

## 2015-12-10 MED ORDER — ACETAMINOPHEN 325 MG PO TABS: 650.0000 mg | ORAL_TABLET | ORAL | Status: DC | PRN

## 2015-12-10 MED ORDER — MORPHINE SULFATE (PF) 4 MG/ML IV SOLN
4.0000 mg | Freq: Once | INTRAVENOUS | Status: AC
  Administered 2015-12-10: 4 mg via INTRAVENOUS
  Filled 2015-12-10: qty 1

## 2015-12-10 MED ORDER — PANTOPRAZOLE SODIUM 40 MG PO TBEC
40.0000 mg | DELAYED_RELEASE_TABLET | Freq: Every day | ORAL | Status: DC
  Administered 2015-12-11: 40 mg via ORAL
  Filled 2015-12-10: qty 1

## 2015-12-10 MED ORDER — ALPRAZOLAM 0.25 MG PO TABS: 0.2500 mg | ORAL_TABLET | Freq: Two times a day (BID) | ORAL | Status: DC | PRN

## 2015-12-10 MED ORDER — KETOROLAC TROMETHAMINE 30 MG/ML IJ SOLN
30.0000 mg | Freq: Four times a day (QID) | INTRAMUSCULAR | Status: DC | PRN
  Administered 2015-12-10: 30 mg via INTRAVENOUS
  Filled 2015-12-10: qty 1

## 2015-12-10 NOTE — H&P (Signed)
History and Physical    Mariah Torres [id number] DOB: 11/06/1955 DOA: 12/10/2015  PCP: Leamon Arnt, MD Patient coming from: Home  Chief Complaint: chest pain  HPI: Mariah Torres is a 60 y.o. female with medical history significant of HTN, DM2, hyperlipidemia, depression/anxiety, seasonal allergiespresents to the ED with a one-day history of midsternal chest pain and back pain occurred while at work. Patient states chest pain last about 30 seconds and then returns every 5 minutes after that. Chest pain and back pain worse with movement. Also with some occurring at rest. Patient denies any burning sensation. Patient does endorse some shortness of breath with the chest pain. Patient denies any cough, no melena, no hematemesis, no hematochezia, no syncope, no fever, no chills, no vomiting, no palpitations, no diaphoresis, no abdominal pain, no diarrhea, no constipation. Patient does endorse some nausea and some dizziness. Patient denies any family history of premature coronary artery disease.  ED Course: patient seen in the ED, compressive metabolic profile with a potassium of 3 AST of 55 ALT of 57. Lipase level was 51. Point-of-care troponin was 0.002. CBC unremarkable.chest x-ray with basilar atelectasis. CT angiogram chest which was done was negative for PE.no acute findings. Linear atelectasis at the lung bases. No evidence of pneumonia or pulmonary edema.EKG with sinus tachycardia with a heart rate of 109, no ischemic changes noted.patient was given a dose of fentanyl and Vicodin with no significant improvement in her pain. Due to patient's multiple risk factors, hospitalists were called to admit the patient for chest pain rule out.  Review of Systems: As per HPI otherwise 10 point review of systems negative.   Past Medical History:  Diagnosis Date  . Anxiety   . Asthma due to seasonal allergies   . Depression   . Diabetes mellitus without complication ([place])   . High cholesterol     . Hypertension   . Malignant melanoma ([place])   . Obesity     Past Surgical History:  Procedure Laterality Date  . ABDOMINAL HYSTERECTOMY    . JOINT REPLACEMENT Bilateral    knee     reports that she quit smoking about 11 years ago. Her smoking use included Cigarettes. She has a 2.50 pack-year smoking history. She has never used smokeless tobacco. She reports that she drinks alcohol. She reports that she does not use drugs.  Allergies  Allergen Reactions  . Aleve [Naproxen Sodium] Anaphylaxis    Patient reports she tolerates ibuprofen.  . Codeine Itching and Other (See Comments)    jittery  . Penicillins     Has patient had a PCN reaction causing immediate rash, facial/tongue/throat swelling, SOB or lightheadedness with hypotension: No Has patient had a PCN reaction causing severe rash involving mucus membranes or skin necrosis: No Has patient had a PCN reaction that required hospitalization No Has patient had a PCN reaction occurring within the last 10 years: No If all of the above answers are "NO", then may proceed with Cephalosporin use.  . Ace Inhibitors Cough    Family History  Problem Relation Age of Onset  . Multiple sclerosis Mother   . Cirrhosis Father    Mother deceased age 28 from multiple sclerosis.  Father deceased age 46 from Cirrhosis.  Prior to Admission medications   Medication Sig Start Date End Date Taking? Authorizing Provider  aspirin EC 81 MG tablet Take 81 mg by mouth daily.   Yes Historical Provider, MD  atorvastatin (LIPITOR) 40 MG tablet Take 40 mg by mouth  at bedtime. 07/15/14  Yes Historical Provider, MD  cetirizine (ZYRTEC) 10 MG tablet Take 10 mg by mouth daily.   Yes Historical Provider, MD  cholecalciferol (VITAMIN D) 1000 UNITS tablet Take 2,000 Units by mouth daily.   Yes Historical Provider, MD  empagliflozin (JARDIANCE) 25 MG TABS tablet Take 25 mg by mouth daily.   Yes Historical Provider, MD  escitalopram (LEXAPRO) 20 MG tablet Take 20  mg by mouth daily.   Yes Historical Provider, MD  Liraglutide (VICTOZA [place]) Inject 0.06 mLs into the skin daily.   Yes Historical Provider, MD  losartan-hydrochlorothiazide (HYZAAR) 100-25 MG per tablet Take 1 tablet by mouth daily. 08/09/14  Yes Historical Provider, MD  metFORMIN (GLUCOPHAGE) 1000 MG tablet Take 1,000 mg by mouth 2 (two) times daily with a meal.   Yes Historical Provider, MD  sodium-potassium bicarbonate (ALKA-SELTZER GOLD) TBEF dissolvable tablet Take 1 tablet by mouth daily as needed.   Yes Historical Provider, MD  ALPRAZolam Duanne Moron) 0.5 MG tablet Take one tablet 30 min. piror to MRI.  May repeat once if needed.  May not drink alcohol, drive or operate dangerous equipment while taking. 03/18/15   Britt Bottom, MD    Physical Exam: Vitals:   12/10/15 1530 12/10/15 1600 12/10/15 1700 12/10/15 1753  BP: 114/79 107/75 115/81 122/81  Pulse: 94 96 95 93  Resp:    16  Temp:      TempSrc:      SpO2: 93% 92% 92% 96%  Weight:      Height:          Constitutional: NAD, calm, comfortable Vitals:   12/10/15 1530 12/10/15 1600 12/10/15 1700 12/10/15 1753  BP: 114/79 107/75 115/81 122/81  Pulse: 94 96 95 93  Resp:    16  Temp:      TempSrc:      SpO2: 93% 92% 92% 96%  Weight:      Height:       Eyes: PERRLA,EOMI, lids and conjunctivae normal ENMT: Mucous membranes are moist. Posterior pharynx clear of any exudate or lesions.Normal dentition.  Neck: normal, supple, no masses, no thyromegaly Respiratory: clear to auscultation bilaterally, no wheezing, no crackles. Normal respiratory effort. No accessory muscle use.  Cardiovascular: Regular rate and rhythm, no murmurs / rubs / gallops. No extremity edema. 2+ pedal pulses. No carotid bruits. Chest wall with no rashes or lesions noted. Abdomen: no tenderness, no masses palpated. No hepatosplenomegaly. Bowel sounds positive.  Musculoskeletal: no clubbing / cyanosis. No joint deformity upper and lower extremities. Good ROM, no  contractures. Normal muscle tone.  Skin: no rashes, lesions, ulcers. No induration Neurologic: CN 2-12 grossly intact. Sensation intact, DTR normal. Strength 5/5 in all 4.  Psychiatric: Normal judgment and insight. Alert and oriented x 3. Normal mood.   Labs on Admission: I have personally reviewed following labs and imaging studies  CBC:  Recent Labs Lab 12/10/15 1022  WBC 9.7  HGB 14.4  HCT 40.8  MCV 84.5  PLT [id number]   Basic Metabolic Panel:  Recent Labs Lab 12/10/15 1022  NA 137  K 3.0*  CL 100*  CO2 25  GLUCOSE 132*  BUN 17  CREATININE 0.85  CALCIUM 10.3  MG 1.6*   GFR: Estimated Creatinine Clearance: 78 mL/min (by C-G formula based on SCr of 0.85 mg/dL). Liver Function Tests:  Recent Labs Lab 12/10/15 1022  AST 55*  ALT 57*  ALKPHOS 65  BILITOT 1.1  PROT 7.8  ALBUMIN 4.5  Recent Labs Lab 12/10/15 1022  LIPASE 51   No results for input(s): AMMONIA in the last 168 hours. Coagulation Profile: No results for input(s): INR, PROTIME in the last 168 hours. Cardiac Enzymes: No results for input(s): CKTOTAL, CKMB, CKMBINDEX, TROPONINI in the last 168 hours. BNP (last 3 results) No results for input(s): PROBNP in the last 8760 hours. HbA1C: No results for input(s): HGBA1C in the last 72 hours. CBG: No results for input(s): GLUCAP in the last 168 hours. Lipid Profile: No results for input(s): CHOL, HDL, LDLCALC, TRIG, CHOLHDL, LDLDIRECT in the last 72 hours. Thyroid Function Tests: No results for input(s): TSH, T4TOTAL, FREET4, T3FREE, THYROIDAB in the last 72 hours. Anemia Panel: No results for input(s): VITAMINB12, FOLATE, FERRITIN, TIBC, IRON, RETICCTPCT in the last 72 hours. Urine analysis:    Component Value Date/Time   COLORURINE AMBER BIOCHEMICALS MAY BE AFFECTED BY COLOR (A) 10/28/2008 0715   APPEARANCEUR CLOUDY (A) 10/28/2008 0715   LABSPEC 1.024 10/28/2008 0715   PHURINE 6.0 10/28/2008 0715   GLUCOSEU NEGATIVE 10/28/2008 0715   HGBUR  NEGATIVE 10/28/2008 0715   BILIRUBINUR NEGATIVE 10/28/2008 0715   KETONESUR TRACE (A) 10/28/2008 0715   PROTEINUR NEGATIVE 10/28/2008 0715   UROBILINOGEN 1.0 10/28/2008 0715   NITRITE NEGATIVE 10/28/2008 0715   LEUKOCYTESUR  10/28/2008 0715    NEGATIVE MICROSCOPIC NOT DONE ON URINES WITH NEGATIVE PROTEIN, BLOOD, LEUKOCYTES, NITRITE, OR GLUCOSE <1000 mg/dL.   Sepsis Labs: !!!!!!!!!!!!!!!!!!!!!!!!!!!!!!!!!!!!!!!!!!!! @LABRCNTIP (procalcitonin:4,lacticidven:4) )No results found for this or any previous visit (from the past 240 hour(s)).   Radiological Exams on Admission: Dg Chest 2 View  Result Date: 12/10/2015 CLINICAL DATA:  Mid chest pain radiates to back over 4 days. EXAM: CHEST  2 VIEW COMPARISON:  None. FINDINGS: Bibasilar atelectasis, right greater than left. No dense focal airspace consolidation. No pulmonary edema or pleural effusion. The cardiopericardial silhouette is within normal limits for size. The visualized bony structures of the thorax are intact. Telemetry leads overlie the chest. IMPRESSION: Basilar atelectasis. Electronically Signed   By: Misty Stanley M.D.   On: 12/10/2015 10:55   Ct Angio Chest Pe W/cm &/or Wo Cm  Result Date: 12/10/2015 CLINICAL DATA:  Chest and upper back pain since yesterday. Short of breath with movement. Tachycardia. Mild hypoxia. EXAM: CT ANGIOGRAPHY CHEST WITH CONTRAST TECHNIQUE: Multidetector CT imaging of the chest was performed using the standard protocol during bolus administration of intravenous contrast. Multiplanar CT image reconstructions and MIPs were obtained to evaluate the vascular anatomy. CONTRAST:  100 mL of Isovue 370 intravenous contrast COMPARISON:  Current chest radiographs. FINDINGS: Cardiovascular: Satisfactory opacification of the pulmonary arteries to the segmental level. No evidence of pulmonary embolism. Normal heart size. No pericardial effusion. Mild coronary artery calcifications. Great vessels are normal in caliber. No  aortic dissection or significant plaque. Mediastinum/Nodes: No enlarged mediastinal, hilar, or axillary lymph nodes. Thyroid gland, trachea, and esophagus demonstrate no significant findings. Lungs/Pleura: Linear opacity noted in both lower lobes and at the bases of the right middle lobe and left upper lobe lingula, consistent atelectasis. No convincing pneumonia. No pulmonary edema. No lung mass or suspicious nodule. No pleural effusion or pneumothorax. Upper Abdomen: Relative enlargement of the lateral segment of the left lobe of the liver. Subtle surface nodularity suggested. Findings raise suspicion for cirrhosis. No acute findings in the visualized upper abdomen. Musculoskeletal: Degenerative changes of the thoracic spine most evident at T7-T8 and T8-T9. No osteoblastic or osteolytic lesions. Review of the MIP images confirms the above findings. IMPRESSION: 1.  No evidence of pulmonary embolus. 2. No acute findings. 3. Linear atelectasis at the lung bases. No evidence of pneumonia or pulmonary edema. Electronically Signed   By: Lajean Manes M.D.   On: 12/10/2015 11:49    EKG: Independently reviewed. Sinus tachycardia HR 109.  Assessment/Plan Principal Problem:   Chest pain Active Problems:   HTN (hypertension)   T2DM (type 2 diabetes mellitus) (HCC)   HLD (hyperlipidemia)   Abnormal LFTs   Anxiety state   Hypokalemia   Hypomagnesemia   #1 atypical chest pain Patient presented with chest pain with features that are atypical. Likely musculoskeletal in nature versus secondary to electrolyte abnormalities.Patient states chest pain worse with movement and midsternal nonradiating and also back pain. Patient describes the pain as sharp lasting 30 seconds and returning every 5 minutes and worse with movement. Point-of-care troponin was negative. Chest x-ray unremarkable. CT angiogram chest was negative for PE, pulmonary edema, infiltrate. Chest wall with no lesions or rashes. Chest pain was not  reproducible.due to patient's multiple risk factors of hypertension hyperlipidemia diabetes will place patient on observation for chest pain rule out. Cycle cardiac enzymes every 6 hours 3. Check a 2-D echo. Check a fasting lipid panel. Check a TSH. Check a hemoglobin A1c. Place on scheduled ibuprofen 800 mg 3 times daily. Follow IV as needed. If 2-D echo is abnormal or troponins are elevated will consult with cardiology. Replete potassium and magnesium.Follow for now.  #2 hypertension Continue home regimen of Hyzaar.  #3 hyperlipidemia Check a fasting lipid panel. Continue home regimen Lipitor.  #4 abnormal LFTs Chronic in nature. May be secondary to statins versus a prior history of hepatitis C antibody positive. Patient currently asymptomatic. Follow LFTs while on statin.  #5 hypokalemia/hypomagnesemia May be secondary to diuretics. Replete. Keep magnesium greater than 2. Keep potassium greater than 4.  #6 diabetes mellitus type 2 Check a hemoglobin A1c. Hold oral hypoglycemic agents. Sliding scale insulin.  #7 depression/anxiety Continue home regimen Lexapro and Xanax.  #8 Seasonal allergies Continue Zyrtec.    DVT prophylaxis: Lovenox Code Status: Full Family Communication: updated patient and daughter at bedside. Disposition Plan: Home once work up complete. Consults called: None Admission status: Place in observation.   [facility] MD [facility] Pager [phone]  If 7PM-7AM, please contact night-coverage [email or web address] Password [email or web address]  12/10/2015, 7:21 PM

## 2015-12-10 NOTE — ED Provider Notes (Signed)
[facility] DEPT Provider Note   CSN: [id number] Arrival date & time: 12/10/15  0919     History   Chief Complaint Chief Complaint  Patient presents with  . Chest Pain  . Back Pain    HPI Mariah Torres is a 60 y.o. female.  60yo F w/ PMH including HTN, HLD, T2DM, melanoma of skin who p/w chest pain and back pain. A few days ago, the patient began having what she calls indigestion for which she was taking antacids without much improvement. Yesterday, she was sitting at her desk at work and began having central chest pain as well as thoracic back pain. The pain becomes very severe with movement and she had difficulty sleeping overnight because any movement made the pain worse. She denies any trauma or change in physical activity recently. She feels short of breath when she moves and the pain becomes severe but no shortness of breath at rest. She endorses some mild lightheadedness this morning as well as one brief episode of nausea, no vomiting or diaphoresis. No leg swelling, recent travel, OCP use, or history of blood clots. No family history of early heart disease or blood clots. Patient does not smoke. She does note that she started taking Victoza 5 days ago.   The history is provided by the patient.  Chest Pain   Associated symptoms include back pain.  Back Pain   Associated symptoms include chest pain.    Past Medical History:  Diagnosis Date  . Diabetes mellitus without complication ([place])   . High cholesterol   . Hypertension   . Malignant melanoma ([place])   . Obesity     Patient Active Problem List   Diagnosis Date Noted  . Chest pain 12/10/2015  . Family history of multiple sclerosis 08/14/2015  . Benign paroxysmal positional vertigo 03/06/2015  . Other headache syndrome 03/06/2015  . Neck pain 03/06/2015  . Abnormal finding on MRI of brain 08/20/2014  . Anxiety state 08/20/2014  . Abnormal findings-skull or head 08/20/2014  . Optic neuritis 08/16/2014  .  HTN (hypertension) 08/16/2014  . T2DM (type 2 diabetes mellitus) ([place]) 08/16/2014  . HLD (hyperlipidemia) 08/16/2014  . Eye pain   . Abnormal LFTs 04/30/2011  . Diabetes mellitus type 2, uncontrolled ([place]) 04/29/2011  . BP (high blood pressure) 04/29/2011  . Cannot sleep 04/29/2011  . 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 Diabetes mellitus, type 2 ([place]) 11/03/2008  . Type 2 diabetes mellitus ([place]) 11/03/2008  . 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  . Arthritis of knee, degenerative 06/03/2008  . Abnormal immunological findings in specimens from other organs, systems and tissues 06/03/2008    Past Surgical History:  Procedure Laterality Date  . ABDOMINAL HYSTERECTOMY    . JOINT REPLACEMENT Bilateral    knee    OB History    No data available       Home Medications    Prior to Admission medications   Medication Sig Start Date End Date Taking? Authorizing Provider  aspirin EC 81 MG tablet Take 81 mg by mouth daily.   Yes Historical Provider, MD  atorvastatin (LIPITOR) 40 MG tablet Take 40 mg by mouth at bedtime. 07/15/14  Yes Historical Provider, MD  cetirizine (ZYRTEC) 10 MG tablet Take 10 mg by mouth daily.   Yes Historical Provider, MD  cholecalciferol (VITAMIN D) 1000 UNITS tablet Take 2,000 Units by mouth daily.   Yes Historical Provider, MD  empagliflozin (JARDIANCE) 25 MG TABS tablet Take 25 mg by mouth daily.  Yes Historical Provider, MD  escitalopram (LEXAPRO) 20 MG tablet Take 20 mg by mouth daily.   Yes Historical Provider, MD  Liraglutide (VICTOZA Roy) Inject 0.06 mLs into the skin daily.   Yes Historical Provider, MD  losartan-hydrochlorothiazide (HYZAAR) 100-25 MG per tablet Take 1 tablet by mouth daily. 08/09/14  Yes Historical Provider, MD  metFORMIN (GLUCOPHAGE) 1000 MG tablet Take 1,000 mg by mouth 2 (two) times daily with a meal.   Yes Historical Provider, MD  sodium-potassium bicarbonate (ALKA-SELTZER GOLD) TBEF dissolvable tablet Take 1 tablet by mouth daily as  needed.   Yes Historical Provider, MD  ALPRAZolam Duanne Moron) 0.5 MG tablet Take one tablet 30 min. piror to MRI.  May repeat once if needed.  May not drink alcohol, drive or operate dangerous equipment while taking. 03/18/15   Britt Bottom, MD    Family History History reviewed. No pertinent family history.  Social History Social History  Substance Use Topics  . Smoking status: Former Smoker    Packs/day: 0.50    Years: 5.00    Types: Cigarettes    Quit date: 09/04/2004  . Smokeless tobacco: Never Used  . Alcohol use 0.0 oz/week     Comment: Rare     Allergies   Aleve [naproxen sodium]; Codeine; Penicillins; and Ace inhibitors   Review of Systems Review of Systems  Cardiovascular: Positive for chest pain.  Musculoskeletal: Positive for back pain.   10 Systems reviewed and are negative for acute change except as noted in the HPI.  Physical Exam Updated Vital Signs BP 122/81   Pulse 93   Temp 98.3 F (36.8 C) (Oral)   Resp 16   Ht 5\' 4"  (1.626 m)   Wt 206 lb (93.4 kg)   SpO2 96%   BMI 35.36 kg/m   Physical Exam  Constitutional: She is oriented to person, place, and time. She appears well-developed and well-nourished. No distress.  uncomfortable  HENT:  Head: Normocephalic and atraumatic.  Moist mucous membranes  Eyes: Conjunctivae are normal. Pupils are equal, round, and reactive to light.  Neck: Neck supple.  Cardiovascular: Regular rhythm and normal heart sounds.  Tachycardia present.   No murmur heard. Upper extremity pulses equal  Pulmonary/Chest: Effort normal and breath sounds normal.  Abdominal: Soft. Bowel sounds are normal. She exhibits no distension. There is no tenderness.  Musculoskeletal: She exhibits no edema.  No focal back tenderness  Neurological: She is alert and oriented to person, place, and time. No sensory deficit.  Fluent speech  Skin: Skin is warm and dry.  Psychiatric: She has a normal mood and affect. Judgment normal.  Nursing note  and vitals reviewed.    ED Treatments / Results  Labs (all labs ordered are listed, but only abnormal results are displayed) Labs Reviewed  BASIC METABOLIC PANEL - Abnormal; Notable for the following:       Result Value   Potassium 3.0 (*)    Chloride 100 (*)    Glucose, Bld 132 (*)    All other components within normal limits  HEPATIC FUNCTION PANEL - Abnormal; Notable for the following:    AST 55 (*)    ALT 57 (*)    All other components within normal limits  CBC  LIPASE, BLOOD  MAGNESIUM  TROPONIN I  TROPONIN I  TROPONIN I  Randolm Idol, ED  Randolm Idol, ED    EKG  EKG Interpretation  Date/Time:  Thursday December 10 2015 09:31:35 EST Ventricular Rate:  109 PR Interval:  QRS Duration: 70 QT Interval:  344 QTC Calculation: 464 R Axis:   -6 Text Interpretation:  Sinus tachycardia Borderline T wave abnormalities tachycardia new from previous Confirmed by Wadsworth Skolnick MD, Tavyn Kurka [id number]) on 12/10/2015 10:50:05 AM       Radiology Dg Chest 2 View  Result Date: 12/10/2015 CLINICAL DATA:  Mid chest pain radiates to back over 4 days. EXAM: CHEST  2 VIEW COMPARISON:  None. FINDINGS: Bibasilar atelectasis, right greater than left. No dense focal airspace consolidation. No pulmonary edema or pleural effusion. The cardiopericardial silhouette is within normal limits for size. The visualized bony structures of the thorax are intact. Telemetry leads overlie the chest. IMPRESSION: Basilar atelectasis. Electronically Signed   By: Misty Stanley M.D.   On: 12/10/2015 10:55   Ct Angio Chest Pe W/cm &/or Wo Cm  Result Date: 12/10/2015 CLINICAL DATA:  Chest and upper back pain since yesterday. Short of breath with movement. Tachycardia. Mild hypoxia. EXAM: CT ANGIOGRAPHY CHEST WITH CONTRAST TECHNIQUE: Multidetector CT imaging of the chest was performed using the standard protocol during bolus administration of intravenous contrast. Multiplanar CT image reconstructions and MIPs  were obtained to evaluate the vascular anatomy. CONTRAST:  100 mL of Isovue 370 intravenous contrast COMPARISON:  Current chest radiographs. FINDINGS: Cardiovascular: Satisfactory opacification of the pulmonary arteries to the segmental level. No evidence of pulmonary embolism. Normal heart size. No pericardial effusion. Mild coronary artery calcifications. Great vessels are normal in caliber. No aortic dissection or significant plaque. Mediastinum/Nodes: No enlarged mediastinal, hilar, or axillary lymph nodes. Thyroid gland, trachea, and esophagus demonstrate no significant findings. Lungs/Pleura: Linear opacity noted in both lower lobes and at the bases of the right middle lobe and left upper lobe lingula, consistent atelectasis. No convincing pneumonia. No pulmonary edema. No lung mass or suspicious nodule. No pleural effusion or pneumothorax. Upper Abdomen: Relative enlargement of the lateral segment of the left lobe of the liver. Subtle surface nodularity suggested. Findings raise suspicion for cirrhosis. No acute findings in the visualized upper abdomen. Musculoskeletal: Degenerative changes of the thoracic spine most evident at T7-T8 and T8-T9. No osteoblastic or osteolytic lesions. Review of the MIP images confirms the above findings. IMPRESSION: 1. No evidence of pulmonary embolus. 2. No acute findings. 3. Linear atelectasis at the lung bases. No evidence of pneumonia or pulmonary edema. Electronically Signed   By: Lajean Manes M.D.   On: 12/10/2015 11:49    Procedures Procedures (including critical care time)  Medications Ordered in ED Medications  morphine 4 MG/ML injection 4 mg (not administered)  potassium chloride SA (K-DUR,KLOR-CON) CR tablet 40 mEq (not administered)  fentaNYL (SUBLIMAZE) injection 50 mcg (50 mcg Intravenous Given 12/10/15 1104)  iopamidol (ISOVUE-370) 76 % injection 100 mL (100 mLs Intravenous Contrast Given 12/10/15 1122)  HYDROcodone-acetaminophen (NORCO/VICODIN)  5-325 MG per tablet 1 tablet (1 tablet Oral Given 12/10/15 1315)     Initial Impression / Assessment and Plan / ED Course  I have reviewed the triage vital signs and the nursing notes.  Pertinent labs & imaging results that were available during my care of the patient were reviewed by me and considered in my medical decision making (see chart for details).  Clinical Course    Pt Presents with a few days of chest pain and thoracic back pain, severe with any movement and better when still. She was uncomfortable but nontoxic on exam. Blood pressure normal, 111/70 L arm and 101/78 R arm. She was mildly tachycardic near 110 during my evaluation and O2  sat 92-94% on room air. EKG shows sinus tachycardia without acute ischemic changes. I was not able to reproduce her chest pain with palpation of chest or back. Obtained above lab work as well as chest x-ray. Pt has anaphylaxis to naproxen therefore did not give aspirin.  DDx is broad and includes ACS given risk factors, PE given tachycardia and mild hypoxia, aortic dissection less likely given no sudden ripping or tearing CP. Obtained CTA chest PE protocol as tachycardia and hypoxia would be more suggestive of PE than dissection. CTA was unremarkable and showed no PE or problems with aorta. Serial troponins negative. AST and ALT slightly elevated but patient does not have any abdominal pain or vomiting therefore I do not feel her symptoms are related. The patient continues to have intermittent chest pain. Although she states that it occurs with movement, she has had chest pain at rest without any movement during my repeat examinations. Her HEART score is 4-5 due to underlying risk factors and age. After long discussion with family, ultimately decided to admit patient for chest pain rule out because of her ongoing chest pain episodes and risk factors. Discussed admission with Dr. Grandville Silos, hospitalist, appreciate his assistance with the patient's care. Patient  admitted for further workup. Final Clinical Impressions(s) / ED Diagnoses   Final diagnoses:  Chest pain, unspecified type  Acute thoracic back pain, unspecified back pain laterality    New Prescriptions New Prescriptions   No medications on file     Sharlett Iles, MD 12/10/15 [phone]

## 2015-12-10 NOTE — ED Notes (Signed)
Delay getting pt off floor.  Taking pt now.

## 2015-12-10 NOTE — ED Triage Notes (Signed)
Pt c/o generalized chest and upper back pain starting yesterday.  Pain score 10/10 w/ movement.  Denies injury.  Denies new pushing/pulling/lifting/straining.  Sts SOB only w/ movement. Pt reports that the pain is so severe that it "takes" her breath.  Pt reports nausea x 5 minutes x 1 episode yesterday.  Hx of HTN, high cholesterol, and DM.  Pt reports that she was just put on Victoza and these symptoms are listed as side effects.

## 2015-12-11 ENCOUNTER — Telehealth (HOSPITAL_COMMUNITY): Payer: Self-pay | Admitting: *Deleted

## 2015-12-11 ENCOUNTER — Other Ambulatory Visit: Payer: Self-pay | Admitting: Student

## 2015-12-11 ENCOUNTER — Observation Stay (HOSPITAL_BASED_OUTPATIENT_CLINIC_OR_DEPARTMENT_OTHER): Payer: BLUE CROSS/BLUE SHIELD

## 2015-12-11 DIAGNOSIS — R7989 Other specified abnormal findings of blood chemistry: Secondary | ICD-10-CM

## 2015-12-11 DIAGNOSIS — I10 Essential (primary) hypertension: Secondary | ICD-10-CM | POA: Diagnosis not present

## 2015-12-11 DIAGNOSIS — M546 Pain in thoracic spine: Secondary | ICD-10-CM

## 2015-12-11 DIAGNOSIS — E785 Hyperlipidemia, unspecified: Secondary | ICD-10-CM | POA: Diagnosis not present

## 2015-12-11 DIAGNOSIS — R0789 Other chest pain: Secondary | ICD-10-CM

## 2015-12-11 DIAGNOSIS — F411 Generalized anxiety disorder: Secondary | ICD-10-CM | POA: Diagnosis not present

## 2015-12-11 DIAGNOSIS — R079 Chest pain, unspecified: Secondary | ICD-10-CM

## 2015-12-11 LAB — CBC
HCT: 38.8 % (ref 36.0–46.0)
HEMOGLOBIN: 13.2 g/dL (ref 12.0–15.0)
MCH: 30.1 pg (ref 26.0–34.0)
MCHC: 34 g/dL (ref 30.0–36.0)
MCV: 88.4 fL (ref 78.0–100.0)
Platelets: 202 10*3/uL (ref 150–400)
RBC: 4.39 MIL/uL (ref 3.87–5.11)
RDW: 13.6 % (ref 11.5–15.5)
WBC: 7.8 10*3/uL (ref 4.0–10.5)

## 2015-12-11 LAB — ECHOCARDIOGRAM COMPLETE
HEIGHTINCHES: 64 in
WEIGHTICAEL: 3343.94 [oz_av]

## 2015-12-11 LAB — LIPID PANEL
CHOLESTEROL: 131 mg/dL (ref 0–200)
HDL: 37 mg/dL — ABNORMAL LOW (ref >40)
LDL Cholesterol: 72 mg/dL (ref 0–99)
Total CHOL/HDL Ratio: 3.5 RATIO
Triglycerides: 111 mg/dL (ref <150)
VLDL: 22 mg/dL (ref 0–40)

## 2015-12-11 LAB — MAGNESIUM: Magnesium: 3.1 mg/dL — ABNORMAL HIGH (ref 1.7–2.4)

## 2015-12-11 LAB — TROPONIN I
Troponin I: <0.03 ng/mL (ref <0.03)
Troponin I: <0.03 ng/mL

## 2015-12-11 LAB — GLUCOSE, CAPILLARY
Glucose-Capillary: 159 mg/dL — ABNORMAL HIGH (ref 65–99)
Glucose-Capillary: 154 mg/dL — ABNORMAL HIGH (ref 65–99)

## 2015-12-11 LAB — BASIC METABOLIC PANEL
Anion gap: 10 (ref 5–15)
BUN: 19 mg/dL (ref 6–20)
CHLORIDE: 100 mmol/L — AB (ref 101–111)
CO2: 26 mmol/L (ref 22–32)
CREATININE: 0.88 mg/dL (ref 0.44–1.00)
Calcium: 9 mg/dL (ref 8.9–10.3)
GFR calc Af Amer: >60 mL/min (ref >60)
Glucose, Bld: 152 mg/dL — ABNORMAL HIGH (ref 65–99)
Potassium: 3.6 mmol/L (ref 3.5–5.1)
SODIUM: 136 mmol/L (ref 135–145)

## 2015-12-11 LAB — PROTIME-INR
INR: 1.07
PROTHROMBIN TIME: 14 s (ref 11.4–15.2)

## 2015-12-11 LAB — TSH: TSH: 1.842 u[IU]/mL (ref 0.350–4.500)

## 2015-12-11 MED ORDER — POTASSIUM CHLORIDE CRYS ER 20 MEQ PO TBCR
40.0000 meq | EXTENDED_RELEASE_TABLET | Freq: Once | ORAL | Status: AC
  Administered 2015-12-11: 40 meq via ORAL
  Filled 2015-12-11: qty 2

## 2015-12-11 MED ORDER — PANTOPRAZOLE SODIUM 40 MG PO TBEC: 40.0000 mg | DELAYED_RELEASE_TABLET | Freq: Every day | ORAL | 3 refills | Status: AC

## 2015-12-11 MED ORDER — IBUPROFEN 800 MG PO TABS: 800.0000 mg | ORAL_TABLET | Freq: Three times a day (TID) | ORAL | 0 refills | Status: AC

## 2015-12-11 NOTE — Discharge Instructions (Signed)
You have a Stress Test scheduled at [facility]. Your doctor has ordered this test to get a better idea of how your heart works. ° °Please arrive 15 minutes early for paperwork.  ° °Location: °[address], [address] °[place], [place] [address] °[phone] ° °Instructions: °· No food/drink after midnight the night before.  °· No caffeine/decaf products 24 hours before, including medicines such as Excedrin or Goody Powders. Call if there are any questions.  °· Wear comfortable clothes and shoes.  °· It is OK to take your morning meds with a sip of water EXCEPT for those types of medicines listed below or otherwise instructed. ° °Special Medication Instructions: °· Beta blockers such as metoprolol (Lopressor/Toprol XL), atenolol (Tenormin), carvedilol (Coreg), nebivolol (Bystolic), propranolol (Inderal) should not be taken for 24 hours before the test. °· Calcium channel blockers such as diltiazem (Cardizem) or verapmil (Calan) should not be taken for 24 hours before the test. °· Remove nitroglycerin patches and do not take nitrate preparations such as Imdur/isosorbide the day of your test. °· No Persantine/Theophylline or Aggrenox medicines should be used within 24 hours of the test.  ° °What To Expect: °The whole test will take several hours. When you arrive in the lab, the technician will inject a small amount of radioactive tracer into your arm through an IV while you are resting quietly. This helps us to form pictures of your heart. You will likely only feel a sting from the IV. After a waiting period, resting pictures will be obtained under a big camera. These are the "before" pictures. ° °Next, you will be prepped for the stress portion of the test. This may include either walking on a treadmill or receiving a medicine that helps to dilate blood vessels in your heart to simulate the effect of exercise on your heart. If you are walking on a treadmill, you will walk at different paces to  try to get your heart rate to a goal number that is based on your age. If your doctor has chosen the pharmacologic test, then you will receive a medicine through your IV that may cause temporary nausea, flushing, shortness of breath and sometimes chest discomfort or vomiting. This is typically short-lived and usually resolves quickly. Your blood pressure and heart rate will be monitored, and we will be watching your EKG on a computer screen for any changes. During this portion of the test, the radiologist will inject another small amount of radioactive tracer into your IV. After a waiting period, you will undergo a second set of pictures. These are the "after" pictures. ° °The doctor reading the test will compare the before-and-after images to look for evidence of heart blockages or heart weakness. In certain instances, this test is done over 2 days but usually only takes 1 day to complete. ° °

## 2015-12-11 NOTE — Telephone Encounter (Signed)
ERROR IN PRIOR TELEPHONE ENCOUNTER

## 2015-12-11 NOTE — Telephone Encounter (Signed)
Patient given detailed instructions per Myocardial Perfusion Study Information Sheet for the test on 12/14/15 at 12:45. Patient notified to arrive 15 minutes early and that it is imperative to arrive on time for appointment to keep from having the test rescheduled.  If you need to cancel or reschedule your appointment, please call the office within 24 hours of your appointment. Failure to do so may result in a cancellation of your appointment, and a $50 no show fee. Patient verbalized understanding.Mariah Torres

## 2015-12-11 NOTE — Progress Notes (Signed)
  Echocardiogram 2D Echocardiogram has been performed.  Jennette Dubin 12/11/2015, 12:52 PM

## 2015-12-11 NOTE — Discharge Summary (Signed)
Physician Discharge Summary  Mariah Torres [id number] DOB: [birth year] DOA: 12/10/2015  PCP: Leamon Arnt, MD  Admit date: 12/10/2015 Discharge date: 12/11/2015  Time spent: 60 minutes  Recommendations for Outpatient Follow-up:  1. Follow-up with ANDY,CAMILLE L, MD in 1-2 weeks. On follow-up patient will need a comprehensive metabolic profile done to follow-up on electrolytes, renal function, liver enzymes. Patient will also need a magnesium level checked. Patient's statin was discontinued on discharge secondary to elevated LFTs and will defer resumption of statin to PCP. 2. Patient is to follow-up with [facility] for outpatient stress test on 12/17/2015.   Discharge Diagnoses:  Principal Problem:   Chest pain Active Problems:   HTN (hypertension)   T2DM (type 2 diabetes mellitus) (HCC)   HLD (hyperlipidemia)   Abnormal LFTs   Anxiety state   Hypokalemia   Hypomagnesemia   Acute thoracic back pain   Discharge Condition: Stable and improved  Diet recommendation: Carb modified diet  Filed Weights   12/10/15 1004 12/10/15 2040 12/11/15 0500  Weight: 93.4 kg (206 lb) 94.8 kg (208 lb 15.9 oz) 94.8 kg (208 lb 15.9 oz)    History of present illness:  Mariah Torres is a 60 y.o. female with medical history significant of HTN, DM2, hyperlipidemia, depression/anxiety, seasonal allergies presented to the ED with a one-day history of midsternal chest pain and back pain occurred while at work. Patient stated chest pain last about 30 seconds and then returned every 5 minutes after that. Chest pain and back pain worse with movement. Also with some occurring at rest. Patient denied any burning sensation. Patient did endorse some shortness of breath with the chest pain. Patient denied any cough, no melena, no hematemesis, no hematochezia, no syncope, no fever, no chills, no vomiting, no palpitations, no diaphoresis, no abdominal pain, no diarrhea, no constipation. Patient did endorse  some nausea and some dizziness. Patient denied any family history of premature coronary artery disease.  ED Course: patient seen in the ED, comprehensive metabolic profile with a potassium of 3 AST of 55 ALT of 57. Lipase level was 51. Point-of-care troponin was 0.002. CBC unremarkable.chest x-ray with basilar atelectasis. CT angiogram chest which was done was negative for PE.no acute findings. Linear atelectasis at the lung bases. No evidence of pneumonia or pulmonary edema.EKG with sinus tachycardia with a heart rate of 109, no ischemic changes noted.patient was given a dose of fentanyl and Vicodin with no significant improvement in her pain. Due to patient's multiple risk factors, hospitalists were called to admit the patient for chest pain rule out.  Hospital Course:  #1 atypical chest pain Patient presented with chest pain with features that were atypical. Likely musculoskeletal in nature versus secondary to electrolyte abnormalities.Patient stated chest pain worse with movement and midsternal nonradiating and also back pain. Patient described the pain as sharp lasting 30 seconds and returning every 5 minutes and worse with movement. Point-of-care troponin was negative. Chest x-ray unremarkable. CT angiogram chest was negative for PE, pulmonary edema, infiltrate. Chest wall with no lesions or rashes. Chest pain was not reproducible.  Due to patient's multiple risk factors of hypertension, hyperlipidemia, diabetes patient was placed on observation for chest pain rule out.  Cardiac enzymes were cycled which was negative 3. Fasting lipid panel obtained had LDL of 72. TSH was within normal limits. 2-D echo was obtained with a EF of 60-65% with a pattern of moderate LVH. Patient was placed empirically on scheduled ibuprofen 800 mg 3 times daily as well  as IV Toradol as needed. Patient improved clinically during the hospitalization and patient be discharged home in stable and improved condition. Patient be  discharged home on 5 days of scheduled ibuprofen. Outpatient stress test has been set up for the patient on 12/17/2015 at 7:30 AM which [facility].   #2 hypertension Patient was maintained on home regimen of Hyzaar.  #3 hyperlipidemia Fasting lipid panel obtained showed a total cholesterol of 131, HDL of 37, LDL of 72. Patient's statin was held secondary to abnormal LFTs. Outpatient follow-up.  #4 abnormal LFTs Patient noted on Epic to have a prior history of abnormal LFTs which was felt to be chronic in nature. Patient's AST was 55 ALT was 57. Patient was noted to be on a statin. Statin was held during the hospitalization. Patient's fasting lipid panel and LDL of 72. Patient did not have any abdominal symptoms. Patient also noted to have a prior history of hepatitis C antibody positive. Patient's statin will be held on discharge and patient only to follow-up with PCP in the outpatient setting.   #5 hypokalemia/hypomagnesemia Patient noted to be hypokalemic and hypomagnesemic on admission. It was felt this may be called to be due to patient's presenting symptoms as well. Patient's electrolytes were repleted during the hospitalization. Outpatient follow-up.  #6 diabetes mellitus type 2 Hemoglobin A1c was pending at time of discharge. Patient's oral hypoglycemic agents were held. Patient was maintained on sliding scale insulin during the hospitalization.   #7 depression/anxiety Patient maintained on home regimen Lexapro and Xanax.  #8 Seasonal allergies Patient was maintained on Claritin during the hospitalization.    Procedures:  2-D echo 12/11/2015----EF 60-65%, moderate LVH.  CT angiogram chest 12/10/2015  Chest x-ray 12/10/2015  Consultations:  None  Discharge Exam: Vitals:   12/11/15 0530 12/11/15 1400  BP: (!) 99/59 112/68  Pulse: 72 82  Resp: 15 18  Temp: 97.7 F (36.5 C) 98 F (36.7 C)    General: NAD Cardiovascular: RRR Respiratory:  CTAB  Discharge Instructions   Discharge Instructions    Diet Carb Modified    Complete by:  As directed    Increase activity slowly    Complete by:  As directed      Current Discharge Medication List    START taking these medications   Details  ibuprofen (ADVIL,MOTRIN) 800 MG tablet Take 1 tablet (800 mg total) by mouth 3 (three) times daily. Take 3 times daily for 5 days then use every 6 hours as needed. Qty: 30 tablet, Refills: 0    pantoprazole (PROTONIX) 40 MG tablet Take 1 tablet (40 mg total) by mouth daily. Qty: 30 tablet, Refills: 3      CONTINUE these medications which have NOT CHANGED   Details  aspirin EC 81 MG tablet Take 81 mg by mouth daily.    cetirizine (ZYRTEC) 10 MG tablet Take 10 mg by mouth daily.    cholecalciferol (VITAMIN D) 1000 UNITS tablet Take 2,000 Units by mouth daily.    empagliflozin (JARDIANCE) 25 MG TABS tablet Take 25 mg by mouth daily.    escitalopram (LEXAPRO) 20 MG tablet Take 20 mg by mouth daily.    Liraglutide (VICTOZA [place]) Inject 0.06 mLs into the skin daily.    losartan-hydrochlorothiazide (HYZAAR) 100-25 MG per tablet Take 1 tablet by mouth daily. Refills: 2    metFORMIN (GLUCOPHAGE) 1000 MG tablet Take 1,000 mg by mouth 2 (two) times daily with a meal.    sodium-potassium bicarbonate (ALKA-SELTZER GOLD) TBEF dissolvable tablet  Take 1 tablet by mouth daily as needed.    ALPRAZolam (XANAX) 0.5 MG tablet Take one tablet 30 min. piror to MRI.  May repeat once if needed.  May not drink alcohol, drive or operate dangerous equipment while taking. Qty: 2 tablet, Refills: 0      STOP taking these medications     atorvastatin (LIPITOR) 40 MG tablet        Allergies  Allergen Reactions  . Aleve [Naproxen Sodium] Anaphylaxis    Patient reports she tolerates ibuprofen.  . Codeine Itching and Other (See Comments)    jittery  . Penicillins     Has patient had a PCN reaction causing immediate rash, facial/tongue/throat swelling,  SOB or lightheadedness with hypotension: No Has patient had a PCN reaction causing severe rash involving mucus membranes or skin necrosis: No Has patient had a PCN reaction that required hospitalization No Has patient had a PCN reaction occurring within the last 10 years: No If all of the above answers are "NO", then may proceed with Cephalosporin use.  Humberto Leep Inhibitors Cough   Follow-up Information    [facility] Follow up on 12/17/2015.   Specialty:  Cardiology Why:  Cardiac Stress Test on 12/17/2015 at 7:30AM. If unable to keep appointment, please call to reschedule.  Contact information: [address] [place]. Schedule an appointment as soon as possible for a visit in 2 week(s).   Specialty:  Family Medicine Why:  f/u in 1-2 weeks. Contact information: [place] [address] [phone]            The results of significant diagnostics from this hospitalization (including imaging, microbiology, ancillary and laboratory) are listed below for reference.    Significant Diagnostic Studies: Dg Chest 2 View  Result Date: 12/10/2015 CLINICAL DATA:  Mid chest pain radiates to back over 4 days. EXAM: CHEST  2 VIEW COMPARISON:  None. FINDINGS: Bibasilar atelectasis, right greater than left. No dense focal airspace consolidation. No pulmonary edema or pleural effusion. The cardiopericardial silhouette is within normal limits for size. The visualized bony structures of the thorax are intact. Telemetry leads overlie the chest. IMPRESSION: Basilar atelectasis. Electronically Signed   By: Misty Stanley M.D.   On: 12/10/2015 10:55   Ct Angio Chest Pe W/cm &/or Wo Cm  Result Date: 12/10/2015 CLINICAL DATA:  Chest and upper back pain since yesterday. Short of breath with movement. Tachycardia. Mild hypoxia. EXAM: CT ANGIOGRAPHY CHEST WITH CONTRAST TECHNIQUE:  Multidetector CT imaging of the chest was performed using the standard protocol during bolus administration of intravenous contrast. Multiplanar CT image reconstructions and MIPs were obtained to evaluate the vascular anatomy. CONTRAST:  100 mL of Isovue 370 intravenous contrast COMPARISON:  Current chest radiographs. FINDINGS: Cardiovascular: Satisfactory opacification of the pulmonary arteries to the segmental level. No evidence of pulmonary embolism. Normal heart size. No pericardial effusion. Mild coronary artery calcifications. Great vessels are normal in caliber. No aortic dissection or significant plaque. Mediastinum/Nodes: No enlarged mediastinal, hilar, or axillary lymph nodes. Thyroid gland, trachea, and esophagus demonstrate no significant findings. Lungs/Pleura: Linear opacity noted in both lower lobes and at the bases of the right middle lobe and left upper lobe lingula, consistent atelectasis. No convincing pneumonia. No pulmonary edema. No lung mass or suspicious nodule. No pleural effusion or pneumothorax. Upper Abdomen: Relative enlargement of the lateral segment of the left lobe  of the liver. Subtle surface nodularity suggested. Findings raise suspicion for cirrhosis. No acute findings in the visualized upper abdomen. Musculoskeletal: Degenerative changes of the thoracic spine most evident at T7-T8 and T8-T9. No osteoblastic or osteolytic lesions. Review of the MIP images confirms the above findings. IMPRESSION: 1. No evidence of pulmonary embolus. 2. No acute findings. 3. Linear atelectasis at the lung bases. No evidence of pneumonia or pulmonary edema. Electronically Signed   By: Lajean Manes M.D.   On: 12/10/2015 11:49    Microbiology: No results found for this or any previous visit (from the past 240 hour(s)).   Labs: Basic Metabolic Panel:  Recent Labs Lab 12/10/15 1022 12/11/15 0219 12/11/15 0452  NA 137  --  136  K 3.0*  --  3.6  CL 100*  --  100*  CO2 25  --  26  GLUCOSE  132*  --  152*  BUN 17  --  19  CREATININE 0.85  --  0.88  CALCIUM 10.3  --  9.0  MG 1.6* 3.1*  --    Liver Function Tests:  Recent Labs Lab 12/10/15 1022  AST 55*  ALT 57*  ALKPHOS 65  BILITOT 1.1  PROT 7.8  ALBUMIN 4.5    Recent Labs Lab 12/10/15 1022  LIPASE 51   No results for input(s): AMMONIA in the last 168 hours. CBC:  Recent Labs Lab 12/10/15 1022 12/11/15 0452  WBC 9.7 7.8  HGB 14.4 13.2  HCT 40.8 38.8  MCV 84.5 88.4  PLT 252 202   Cardiac Enzymes:  Recent Labs Lab 12/10/15 1931 12/11/15 0219 12/11/15 0705  TROPONINI <0.03 <0.03 <0.03   BNP: BNP (last 3 results) No results for input(s): BNP in the last 8760 hours.  ProBNP (last 3 results) No results for input(s): PROBNP in the last 8760 hours.  CBG:  Recent Labs Lab 12/11/15 0759 12/11/15 1157  GLUCAP 159* 154*       Signed:  Garhett Bernhard MD.  [facility] 12/11/2015, 4:08 PM

## 2015-12-12 LAB — HEMOGLOBIN A1C
Hgb A1c MFr Bld: 7.8 % — ABNORMAL HIGH (ref 4.8–5.6)
MEAN PLASMA GLUCOSE: 177 mg/dL

## 2015-12-15 ENCOUNTER — Telehealth (HOSPITAL_COMMUNITY): Payer: Self-pay | Admitting: *Deleted

## 2015-12-15 ENCOUNTER — Other Ambulatory Visit: Payer: Self-pay | Admitting: Gastroenterology

## 2015-12-15 DIAGNOSIS — R079 Chest pain, unspecified: Secondary | ICD-10-CM

## 2015-12-15 NOTE — Telephone Encounter (Signed)
Left message on voicemail in reference to upcoming appointment scheduled for 12/17/15. Phone number given for a call back so details instructions can be given. Donald Jacque W   

## 2015-12-17 ENCOUNTER — Encounter (HOSPITAL_COMMUNITY): Payer: BLUE CROSS/BLUE SHIELD

## 2016-01-05 ENCOUNTER — Ambulatory Visit (HOSPITAL_COMMUNITY)
Admission: RE | Admit: 2016-01-05 | Discharge: 2016-01-05 | Disposition: A | Discharge status: Home / Self Care | Payer: BLUE CROSS/BLUE SHIELD | Source: Ambulatory Visit | Attending: Gastroenterology | Admitting: Gastroenterology

## 2016-01-05 DIAGNOSIS — R079 Chest pain, unspecified: Secondary | ICD-10-CM | POA: Insufficient documentation

## 2016-01-05 MED ORDER — TECHNETIUM TC 99M MEBROFENIN IV KIT
5.1000 | PACK | Freq: Once | INTRAVENOUS | Status: AC | PRN
  Administered 2016-01-05: 5.1 via INTRAVENOUS

## 2016-02-24 ENCOUNTER — Encounter: Payer: Self-pay | Admitting: Student

## 2016-03-18 ENCOUNTER — Encounter: Payer: Self-pay | Admitting: Neurology

## 2016-03-18 ENCOUNTER — Ambulatory Visit (INDEPENDENT_AMBULATORY_CARE_PROVIDER_SITE_OTHER): Payer: BLUE CROSS/BLUE SHIELD | Admitting: Neurology

## 2016-03-18 VITALS — BP 125/85 | HR 86 | Resp 16 | Ht 64.0 in | Wt 208.5 lb

## 2016-03-18 DIAGNOSIS — G4700 Insomnia, unspecified: Secondary | ICD-10-CM

## 2016-03-18 DIAGNOSIS — F419 Anxiety disorder, unspecified: Secondary | ICD-10-CM | POA: Diagnosis not present

## 2016-03-18 DIAGNOSIS — H469 Unspecified optic neuritis: Secondary | ICD-10-CM | POA: Diagnosis not present

## 2016-03-18 DIAGNOSIS — R9089 Other abnormal findings on diagnostic imaging of central nervous system: Secondary | ICD-10-CM | POA: Diagnosis not present

## 2016-03-18 MED ORDER — DOXEPIN HCL 10 MG PO CAPS: 10.0000 mg | ORAL_CAPSULE | Freq: Every day | ORAL | 11 refills | Status: DC

## 2016-03-18 NOTE — Progress Notes (Signed)
[facility]  PATIENT: Mariah Torres DOB: [birth year]  REFERRING DOCTOR OR PCP:  Mariah Torres SOURCE: patient and records from EMR and MRI images on PACS  _________________________________   HISTORICAL  CHIEF COMPLAINT:  Chief Complaint  Patient presents with  . History of Optic Neuritis    Denies further problems with vision.  Denies new sx./fim    HISTORY OF PRESENT ILLNESS:  Mariah Torres is a 61 year old woman with optic neuritis mid August 2016.    MRI showed about a dozen non-specific foci and enahncing right optic nerve.   MRI 03/2015 showed no new lesions.    At that time, MRI of the brain and orbits showed restricted diffusion of the right optic nerve, with enhancement after gadolinium was administered. This was consistent with optic neuritis. I looked again at the MRI images.    There are about a dozen T2/FLAIR hyperintense foci. Most of these appear fairly nonspecific being round and in the deep or subcortical white matter.  A couple are periventricular.   She was admitted to the hospital and received 3 days of IV steroids.     She had a lumbar puncture 09/05/2014. The CSF is borderline with 3 OCB and normal IgG index.     Serologic studies were normal.     Vision:     She feels visual acuity has completely resolved to baseline but she notes vision is slightly dimmed on that side.    Colors are symmetric.   No eye pain.   No diplopia.  Headache:   She gets headaches occasionally, more the past 2 weeks than usual.       Advil knocks them out easily now.   The severe occipital headaches resolved with the injections last visit.    Vertigo:    This has resolved.    No veering with walking.     Anxiety//depression:  She feels anxiety is a little better on Mariah Torres and depression is better.   Her mother died (had MS) and job is tressful  Other Neuro:   She denies any difficulty with her gait, strength or sensation. She has some urinary frequency but this has  been an issue for many years.  She has a positive family history of multiple sclerosis. Specifically, her identical twin sister was diagnosed about 50 (Age 53). She uses walker/wheelchair (is on Mariah Torres).   Her mother had MS and became bedridden and recently died.      REVIEW OF SYSTEMS: Constitutional: No fevers, chills, sweats, or change in appetite.   Has insomnia Eyes: No visual changes, double vision, eye pain Ear, nose and throat: No hearing loss, ear pain, nasal congestion, sore throat Cardiovascular: No chest pain, palpitations Respiratory: No shortness of breath at rest or with exertion.   No wheezes GastrointestinaI: No nausea, vomiting, diarrhea, abdominal pain, fecal incontinence Genitourinary: No dysuria, urinary retention or frequency.  No nocturia. Musculoskeletal: No neck pain, back pain Integumentary: No rash, pruritus, skin lesions Neurological: as above Psychiatric: No depression at this time.  No anxiety Endocrine: No palpitations, diaphoresis, change in appetite, change in weigh or increased thirst Hematologic/Lymphatic: No anemia, purpura, petechiae. Allergic/Immunologic: No itchy/runny eyes, nasal congestion, recent allergic reactions, rashes  ALLERGIES: Allergies  Allergen Reactions  . Aleve [Mariah Torres] Anaphylaxis    Patient reports she tolerates ibuprofen.  . Codeine Itching and Other (See Comments)    jittery  . Penicillins     Has patient had a PCN reaction causing immediate rash, facial/tongue/throat  swelling, SOB or lightheadedness with hypotension: No Has patient had a PCN reaction causing severe rash involving mucus membranes or skin necrosis: No Has patient had a PCN reaction that required hospitalization No Has patient had a PCN reaction occurring within the last 10 years: No If all of the above answers are "NO", then may proceed with Cephalosporin use.  Mariah Torres Inhibitors Cough    HOME MEDICATIONS:  Current Outpatient Prescriptions:   .  ALPRAZolam (XANAX) 0.5 MG tablet, Take one tablet 30 min. piror to MRI.  May repeat once if needed.  May not drink alcohol, drive or operate dangerous equipment while taking., Disp: 2 tablet, Rfl: 0 .  aspirin EC 81 MG tablet, Take 81 mg by mouth daily., Disp: , Rfl:  .  cetirizine (ZYRTEC) 10 MG tablet, Take 10 mg by mouth daily., Disp: , Rfl:  .  cholecalciferol (VITAMIN D) 1000 UNITS tablet, Take 2,000 Units by mouth daily., Disp: , Rfl:  .  empagliflozin (JARDIANCE) 25 MG TABS tablet, Take 25 mg by mouth daily., Disp: , Rfl:  .  escitalopram (Mariah Torres) 20 MG tablet, Take 20 mg by mouth daily., Disp: , Rfl:  .  Liraglutide (VICTOZA [place]), Inject 0.06 mLs into the skin daily., Disp: , Rfl:  .  losartan-hydrochlorothiazide (HYZAAR) 100-25 MG per tablet, Take 1 tablet by mouth daily., Disp: , Rfl: 2 .  metFORMIN (GLUCOPHAGE) 1000 MG tablet, Take 1,000 mg by mouth 2 (two) times daily with a meal., Disp: , Rfl:  .  pantoprazole (PROTONIX) 40 MG tablet, Take 1 tablet (40 mg total) by mouth daily., Disp: 30 tablet, Rfl: 3 .  doxepin (SINEQUAN) 10 MG capsule, Take 1 capsule (10 mg total) by mouth at bedtime., Disp: 30 capsule, Rfl: 11  PAST MEDICAL HISTORY: Past Medical History:  Diagnosis Date  . Anxiety   . Asthma due to seasonal allergies   . Depression   . Diabetes mellitus without complication ([place])   . High cholesterol   . Hypertension   . Malignant melanoma ([place])   . Obesity     PAST SURGICAL HISTORY: Past Surgical History:  Procedure Laterality Date  . ABDOMINAL HYSTERECTOMY    . JOINT REPLACEMENT Bilateral    knee    FAMILY HISTORY: Family History  Problem Relation Age of Onset  . Multiple sclerosis Mother   . Cirrhosis Father     SOCIAL HISTORY:  Social History   Social History  . Marital status: Divorced    Spouse name: N/A  . Number of children: N/A  . Years of education: N/A   Occupational History  . Not on file.   Social History Main Topics  . Smoking  status: Former Smoker    Packs/day: 0.50    Years: 5.00    Types: Cigarettes    Quit date: 09/04/2004  . Smokeless tobacco: Never Used  . Alcohol use 0.0 oz/week     Comment: Rare  . Drug use: No  . Sexual activity: Not on file   Other Topics Concern  . Not on file   Social History Narrative  . No narrative on file     PHYSICAL EXAM  Vitals:   03/18/16 0822  BP: 125/85  Pulse: 86  Resp: 16  Weight: 208 lb 8 oz (94.6 kg)  Height: 5\' 4"  (1.626 m)    Body mass index is 35.79 kg/m.   General: The patient is well-developed and well-nourished and in no acute distress.   Funduscopic examination shows normal optic  discs and retinal vessels.  Neurologic Exam  Mental status: The patient is alert and oriented x 3 at the time of the examination. The patient has apparent normal recent and remote memory, with an apparently normal attention span and concentration ability.   Speech is normal.  Cranial nerves: Extraocular movements are full. 1+ right APD. Color vision is slightly dimmed on her OD. l.  Facial symmetry is present. There is good facial sensation to soft touch bilaterally.Facial strength is normal.  Trapezius and sternocleidomastoid strength is normal. No dysarthria is noted.  The tongue is midline, and the patient has symmetric elevation of the soft palate. No obvious hearing deficits are noted.  Motor:  Muscle bulk is normal.   Tone is normal. Strength is  5 / 5 in all 4 extremities.   Sensory: Sensory testing is intact to ouch and vibration sensation in all 4 extremities.  Coordination: Cerebellar testing reveals good finger-nose-finger and heel-to-shin bilaterally.  Gait and station: Station is normal.   Gait is normal but tandem gait is wide Romberg is negative.   Reflexes: Deep tendon reflexes are symmetric and normal bilaterally.         DIAGNOSTIC DATA (LABS, IMAGING, TESTING) - I reviewed patient records, labs, notes, testing and imaging myself where  available.  Lab Results  Component Value Date   WBC 7.8 12/11/2015   HGB 13.2 12/11/2015   HCT 38.8 12/11/2015   MCV 88.4 12/11/2015   PLT 202 12/11/2015      Component Value Date/Time   NA 136 12/11/2015 0452   K 3.6 12/11/2015 0452   CL 100 (L) 12/11/2015 0452   CO2 26 12/11/2015 0452   GLUCOSE 152 (H) 12/11/2015 0452   BUN 19 12/11/2015 0452   CREATININE 0.88 12/11/2015 0452   CALCIUM 9.0 12/11/2015 0452   PROT 7.8 12/10/2015 1022   ALBUMIN 4.5 12/10/2015 1022   AST 55 (H) 12/10/2015 1022   ALT 57 (H) 12/10/2015 1022   ALKPHOS 65 12/10/2015 1022   BILITOT 1.1 12/10/2015 1022   GFRNONAA >60 12/11/2015 0452   GFRAA >60 12/11/2015 0452   Lab Results  Component Value Date   CHOL 131 12/11/2015   HDL 37 (L) 12/11/2015   LDLCALC 72 12/11/2015   TRIG 111 12/11/2015   CHOLHDL 3.5 12/11/2015   Lab Results  Component Value Date   HGBA1C 7.8 (H) 12/11/2015       ASSESSMENT AND PLAN  Optic neuritis - Plan: MR BRAIN W WO CONTRAST  Abnormal finding on MRI of brain - Plan: MR BRAIN W WO CONTRAST  Anxiety  Insomnia, unspecified type   1.  Mrs. Meharg has an interesting history and presentation. She has optic neuritis and some spots in the MRI. However, they appear to be nonspecific and unchanged between 2016 2017. She also has a very positive history of MS (identical twin sister and mother). Therefore, she has a  higher than typical risk of conversion from clinically isolated syndrome to MS.  We will check an MRI of the brain to see if there has been subclinical progression. If present, then she likely has MS and I will start her on a disease modifying therapy.  2.   Stay active.   3.   Doxepin for insomnia  4.   She will return in 12 months or sooner if there are new or worsening neurologic symptoms.    Richard A. Felecia Shelling, MD, PhD 0/01/6008, 93:23 AM Certified in Neurology, Clinical Neurophysiology, Sleep Medicine, Pain Medicine  and Neuroimaging  [facility] [address] [place], [place] [address] [phone]

## 2016-05-06 ENCOUNTER — Ambulatory Visit
Admission: RE | Admit: 2016-05-06 | Discharge: 2016-05-06 | Disposition: A | Discharge status: Home / Self Care | Payer: BLUE CROSS/BLUE SHIELD | Source: Ambulatory Visit | Attending: Neurology | Admitting: Neurology

## 2016-05-06 DIAGNOSIS — R9089 Other abnormal findings on diagnostic imaging of central nervous system: Secondary | ICD-10-CM

## 2016-05-06 DIAGNOSIS — H469 Unspecified optic neuritis: Secondary | ICD-10-CM

## 2016-05-06 MED ORDER — GADOBENATE DIMEGLUMINE 529 MG/ML IV SOLN
20.0000 mL | Freq: Once | INTRAVENOUS | Status: AC | PRN
  Administered 2016-05-06: 20 mL via INTRAVENOUS

## 2016-05-09 ENCOUNTER — Telehealth: Payer: Self-pay | Admitting: *Deleted

## 2016-05-09 NOTE — Telephone Encounter (Signed)
-----   Message from Britt Bottom, MD sent at 05/08/2016  5:09 PM EDT ----- Please let her know that the MRI of the brain is unchanged. This makes MS less likely.

## 2016-05-09 NOTE — Telephone Encounter (Signed)
LMTC./fim 

## 2016-05-11 ENCOUNTER — Telehealth: Payer: Self-pay | Admitting: *Deleted

## 2016-05-11 NOTE — Telephone Encounter (Signed)
I have spoken with Mariah Torres this afternoon and per RAS, reviewed MRI results as below.  She verbalized understanding of same/fim

## 2016-05-11 NOTE — Telephone Encounter (Signed)
See result note/fim 

## 2016-05-11 NOTE — Telephone Encounter (Signed)
Pt would like a call back at [phone]

## 2016-05-11 NOTE — Telephone Encounter (Signed)
-----   Message from Britt Bottom, MD sent at 05/08/2016  5:09 PM EDT ----- Please let her know that the MRI of the brain is unchanged. This makes MS less likely.

## 2016-12-16 ENCOUNTER — Ambulatory Visit: Payer: BLUE CROSS/BLUE SHIELD | Admitting: Family Medicine

## 2017-03-24 ENCOUNTER — Telehealth: Payer: Self-pay | Admitting: *Deleted

## 2017-03-24 ENCOUNTER — Ambulatory Visit: Payer: BLUE CROSS/BLUE SHIELD | Admitting: Neurology

## 2017-03-24 NOTE — Telephone Encounter (Signed)
No showed follow up appointment. 

## 2017-05-30 ENCOUNTER — Encounter: Payer: Self-pay | Admitting: *Deleted

## 2017-08-14 ENCOUNTER — Other Ambulatory Visit: Payer: Self-pay | Admitting: Physician Assistant

## 2017-08-14 DIAGNOSIS — Z1231 Encounter for screening mammogram for malignant neoplasm of breast: Secondary | ICD-10-CM

## 2017-09-06 ENCOUNTER — Ambulatory Visit
Admission: RE | Admit: 2017-09-06 | Discharge: 2017-09-06 | Disposition: A | Discharge status: Home / Self Care | Payer: BLUE CROSS/BLUE SHIELD | Source: Ambulatory Visit | Attending: Physician Assistant | Admitting: Physician Assistant

## 2017-09-06 DIAGNOSIS — Z1231 Encounter for screening mammogram for malignant neoplasm of breast: Secondary | ICD-10-CM

## 2017-11-03 NOTE — Progress Notes (Signed)
Need orders in epic.  Surgery on 11/07/2017.

## 2017-11-03 NOTE — Progress Notes (Signed)
ECHO 12-11-15 epic

## 2017-11-03 NOTE — Patient Instructions (Addendum)
Mariah Torres  11/03/2017   Your procedure is scheduled on: 11-07-17   Report to [facility] Main  Entrance    Report to admitting at 3:15PM    Call this number if you have problems the morning of surgery [phone]     Remember: Do not eat food After Midnight. YOU MAY HAVE CLEAR LIQUIDS FROM MIDNIGHT UNTIL 11:45AM. NOTHING BY MOUTH AFTER 11:45AM! BRUSH YOUR TEETH MORNING OF SURGERY AND RINSE YOUR MOUTH OUT, NO CHEWING GUM CANDY OR MINTS.      CLEAR LIQUID DIET   Foods Allowed                                                                     Foods Excluded  Coffee and tea, regular and decaf                             liquids that you cannot  Plain Jell-O in any flavor                                             see through such as: Fruit ices (not with fruit pulp)                                     milk, soups, orange juice  Iced Popsicles                                    All solid food Carbonated beverages, regular and diet                                    Cranberry, grape and apple juices Sports drinks like Gatorade Lightly seasoned clear broth or consume(fat free) Sugar, honey syrup  Sample Menu Breakfast                                Lunch                                     Supper Cranberry juice                    Beef broth                            Chicken broth Jell-O                                     Grape juice  Apple juice Coffee or tea                        Jell-O                                      Popsicle                                                Coffee or tea                        Coffee or tea  _____________________________________________________________________      Take these medicines the morning of surgery with A SIP OF WATER:  escitalopram, pantoprazole(PROTONIX)    DO NOT TAKE ANY DIABETIC MEDICATIONS DAY OF YOUR SURGERY!!!!!                               You may not have  any metal on your body including hair pins and              piercings  Do not wear jewelry, make-up, lotions, powders or perfumes, deodorant             Do not wear nail polish.  Do not shave  48 hours prior to surgery.           Do not bring valuables to the hospital. Mariah Torres.  Contacts, dentures or bridgework may not be worn into surgery.  Leave suitcase in the car. After surgery it may be brought to your room.                Please read over the following fact sheets you were given: _____________________________________________________________________             [facility] - Preparing for Surgery Before surgery, you can play an important role.  Because skin is not sterile, your skin needs to be as free of germs as possible.  You can reduce the number of germs on your skin by washing with CHG (chlorahexidine gluconate) soap before surgery.  CHG is an antiseptic cleaner which kills germs and bonds with the skin to continue killing germs even after washing. Please DO NOT use if you have an allergy to CHG or antibacterial soaps.  If your skin becomes reddened/irritated stop using the CHG and inform your nurse when you arrive at Short Stay. Do not shave (including legs and underarms) for at least 48 hours prior to the first CHG shower.  You may shave your face/neck. Please follow these instructions carefully:  1.  Shower with CHG Soap the night before surgery and the  morning of Surgery.  2.  If you choose to wash your hair, wash your hair first as usual with your  normal  shampoo.  3.  After you shampoo, rinse your hair and body thoroughly to remove the  shampoo.                           4.  Use CHG as you would  any other liquid soap.  You can apply chg directly  to the skin and wash                       Gently with a scrungie or clean washcloth.  5.  Apply the CHG Soap to your body ONLY FROM THE NECK DOWN.   Do not use on face/ open                            Wound or open sores. Avoid contact with eyes, ears mouth and genitals (private parts).                       Wash face,  Genitals (private parts) with your normal soap.             6.  Wash thoroughly, paying special attention to the area where your surgery  will be performed.  7.  Thoroughly rinse your body with warm water from the neck down.  8.  DO NOT shower/wash with your normal soap after using and rinsing off  the CHG Soap.                9.  Pat yourself dry with a clean towel.            10.  Wear clean pajamas.            11.  Place clean sheets on your bed the night of your first shower and do not  sleep with pets. Day of Surgery : Do not apply any lotions/deodorants the morning of surgery.  Please wear clean clothes to the hospital/surgery center.  FAILURE TO FOLLOW THESE INSTRUCTIONS MAY RESULT IN THE CANCELLATION OF YOUR SURGERY PATIENT SIGNATURE_________________________________  NURSE SIGNATURE__________________________________  ________________________________________________________________________   Mariah Torres  An incentive spirometer is a tool that can help keep your lungs clear and active. This tool measures how well you are filling your lungs with each breath. Taking long deep breaths may help reverse or decrease the chance of developing breathing (pulmonary) problems (especially infection) following:  A long period of time when you are unable to move or be active. BEFORE THE PROCEDURE   If the spirometer includes an indicator to show your best effort, your nurse or respiratory therapist will set it to a desired goal.  If possible, sit up straight or lean slightly forward. Try not to slouch.  Hold the incentive spirometer in an upright position. INSTRUCTIONS FOR USE  1. Sit on the edge of your bed if possible, or sit up as far as you can in bed or on a chair. 2. Hold the incentive spirometer in an upright position. 3. Breathe  out normally. 4. Place the mouthpiece in your mouth and seal your lips tightly around it. 5. Breathe in slowly and as deeply as possible, raising the piston or the ball toward the top of the column. 6. Hold your breath for 3-5 seconds or for as long as possible. Allow the piston or ball to fall to the bottom of the column. 7. Remove the mouthpiece from your mouth and breathe out normally. 8. Rest for a few seconds and repeat Steps 1 through 7 at least 10 times every 1-2 hours when you are awake. Take your time and take a few normal breaths between deep breaths. 9. The spirometer may include an indicator to show your best effort. Use the indicator  as a goal to work toward during each repetition. 10. After each set of 10 deep breaths, practice coughing to be sure your lungs are clear. If you have an incision (the cut made at the time of surgery), support your incision when coughing by placing a pillow or rolled up towels firmly against it. Once you are able to get out of bed, walk around indoors and cough well. You may stop using the incentive spirometer when instructed by your caregiver.  RISKS AND COMPLICATIONS  Take your time so you do not get dizzy or light-headed.  If you are in pain, you may need to take or ask for pain medication before doing incentive spirometry. It is harder to take a deep breath if you are having pain. AFTER USE  Rest and breathe slowly and easily.  It can be helpful to keep track of a log of your progress. Your caregiver can provide you with a simple table to help with this. If you are using the spirometer at home, follow these instructions: [place] IF:   You are having difficultly using the spirometer.  You have trouble using the spirometer as often as instructed.  Your pain medication is not giving enough relief while using the spirometer.  You develop fever of 100.5 F (38.1 C) or higher. SEEK IMMEDIATE MEDICAL CARE IF:   You cough up bloody  sputum that had not been present before.  You develop fever of 102 F (38.9 C) or greater.  You develop worsening pain at or near the incision site. MAKE SURE YOU:   Understand these instructions.  Will watch your condition.  Will get help right away if you are not doing well or get worse. Document Released: 05/09/2006 Document Revised: 03/21/2011 Document Reviewed: 07/10/2006 ExitCare Patient Information 2014 ExitCare, [place].   ________________________________________________________________________  WHAT IS A BLOOD TRANSFUSION? Blood Transfusion Information  A transfusion is the replacement of blood or some of its parts. Blood is made up of multiple cells which provide different functions.  Red blood cells carry oxygen and are used for blood loss replacement.  White blood cells fight against infection.  Platelets control bleeding.  Plasma helps clot blood.  Other blood products are available for specialized needs, such as hemophilia or other clotting disorders. BEFORE THE TRANSFUSION  Who gives blood for transfusions?   Healthy volunteers who are fully evaluated to make sure their blood is safe. This is blood bank blood. Transfusion therapy is the safest it has ever been in the practice of medicine. Before blood is taken from a donor, a complete history is taken to make sure that person has no history of diseases nor engages in risky social behavior (examples are intravenous drug use or sexual activity with multiple partners). The donor's travel history is screened to minimize risk of transmitting infections, such as malaria. The donated blood is tested for signs of infectious diseases, such as HIV and hepatitis. The blood is then tested to be sure it is compatible with you in order to minimize the chance of a transfusion reaction. If you or a relative donates blood, this is often done in anticipation of surgery and is not appropriate for emergency situations. It takes many days  to process the donated blood. RISKS AND COMPLICATIONS Although transfusion therapy is very safe and saves many lives, the main dangers of transfusion include:   Getting an infectious disease.  Developing a transfusion reaction. This is an allergic reaction to something in the blood you were  given. Every precaution is taken to prevent this. The decision to have a blood transfusion has been considered carefully by your caregiver before blood is given. Blood is not given unless the benefits outweigh the risks. AFTER THE TRANSFUSION  Right after receiving a blood transfusion, you will usually feel much better and more energetic. This is especially true if your red blood cells have gotten low (anemic). The transfusion raises the level of the red blood cells which carry oxygen, and this usually causes an energy increase.  The nurse administering the transfusion will monitor you carefully for complications. HOME CARE INSTRUCTIONS  No special instructions are needed after a transfusion. You may find your energy is better. Speak with your caregiver about any limitations on activity for underlying diseases you may have. SEEK MEDICAL CARE IF:   Your condition is not improving after your transfusion.  You develop redness or irritation at the intravenous (IV) site. SEEK IMMEDIATE MEDICAL CARE IF:  Any of the following symptoms occur over the next 12 hours:  Shaking chills.  You have a temperature by mouth above 102 F (38.9 C), not controlled by medicine.  Chest, back, or muscle pain.  People around you feel you are not acting correctly or are confused.  Shortness of breath or difficulty breathing.  Dizziness and fainting.  You get a rash or develop hives.  You have a decrease in urine output.  Your urine turns a dark color or changes to pink, red, or brown. Any of the following symptoms occur over the next 10 days:  You have a temperature by mouth above 102 F (38.9 C), not controlled  by medicine.  Shortness of breath.  Weakness after normal activity.  The white part of the eye turns yellow (jaundice).  You have a decrease in the amount of urine or are urinating less often.  Your urine turns a dark color or changes to pink, red, or brown. Document Released: 12/25/1999 Document Revised: 03/21/2011 Document Reviewed: 08/13/2007 ExitCare Patient Information 2014 Exit[employer], [place].  _______________________________________________________________________   WHAT IS A BLOOD TRANSFUSION? Blood Transfusion Information  A transfusion is the replacement of blood or some of its parts. Blood is made up of multiple cells which provide different functions.  Red blood cells carry oxygen and are used for blood loss replacement.  White blood cells fight against infection.  Platelets control bleeding.  Plasma helps clot blood.  Other blood products are available for specialized needs, such as hemophilia or other clotting disorders. BEFORE THE TRANSFUSION  Who gives blood for transfusions?   Healthy volunteers who are fully evaluated to make sure their blood is safe. This is blood bank blood. Transfusion therapy is the safest it has ever been in the practice of medicine. Before blood is taken from a donor, a complete history is taken to make sure that person has no history of diseases nor engages in risky social behavior (examples are intravenous drug use or sexual activity with multiple partners). The donor's travel history is screened to minimize risk of transmitting infections, such as malaria. The donated blood is tested for signs of infectious diseases, such as HIV and hepatitis. The blood is then tested to be sure it is compatible with you in order to minimize the chance of a transfusion reaction. If you or a relative donates blood, this is often done in anticipation of surgery and is not appropriate for emergency situations. It takes many days to process the donated  blood. RISKS AND COMPLICATIONS Although transfusion therapy  is very safe and saves many lives, the main dangers of transfusion include:   Getting an infectious disease.  Developing a transfusion reaction. This is an allergic reaction to something in the blood you were given. Every precaution is taken to prevent this. The decision to have a blood transfusion has been considered carefully by your caregiver before blood is given. Blood is not given unless the benefits outweigh the risks. AFTER THE TRANSFUSION  Right after receiving a blood transfusion, you will usually feel much better and more energetic. This is especially true if your red blood cells have gotten low (anemic). The transfusion raises the level of the red blood cells which carry oxygen, and this usually causes an energy increase.  The nurse administering the transfusion will monitor you carefully for complications. HOME CARE INSTRUCTIONS  No special instructions are needed after a transfusion. You may find your energy is better. Speak with your caregiver about any limitations on activity for underlying diseases you may have. SEEK MEDICAL CARE IF:   Your condition is not improving after your transfusion.  You develop redness or irritation at the intravenous (IV) site. SEEK IMMEDIATE MEDICAL CARE IF:  Any of the following symptoms occur over the next 12 hours:  Shaking chills.  You have a temperature by mouth above 102 F (38.9 C), not controlled by medicine.  Chest, back, or muscle pain.  People around you feel you are not acting correctly or are confused.  Shortness of breath or difficulty breathing.  Dizziness and fainting.  You get a rash or develop hives.  You have a decrease in urine output.  Your urine turns a dark color or changes to pink, red, or brown. Any of the following symptoms occur over the next 10 days:  You have a temperature by mouth above 102 F (38.9 C), not controlled by  medicine.  Shortness of breath.  Weakness after normal activity.  The white part of the eye turns yellow (jaundice).  You have a decrease in the amount of urine or are urinating less often.  Your urine turns a dark color or changes to pink, red, or brown. Document Released: 12/25/1999 Document Revised: 03/21/2011 Document Reviewed: 08/13/2007 [facility] Patient Information 2014 [place], [place].  _______________________________________________________________________

## 2017-11-05 NOTE — H&P (Signed)
TOTAL KNEE REVISION ADMISSION H&P  Patient is being admitted for right revision medial unicompartmental knee arthroplasty.  Subjective:  Chief Complaint:   Right knee pain s/p UKR.  HPI: Mariah Torres, 62 y.o. female, has a history of pain and functional disability in the right knee(s) due to failed previous arthroplasty (poly) and patient has failed non-surgical conservative treatments for greater than 12 weeks to include NSAID's and/or analgesics, use of assistive devices and activity modification. The indications for the revision of the total knee arthroplasty are dislocation of the poly. Onset of symptoms was abrupt starting days ago with stable course since that time.  Prior procedures on the right knee(s) include unicompartmental arthroplasty.  Patient currently rates pain in the right knee(s) at 9 out of 10 with activity. There is worsening of pain with activity and weight bearing, pain that interferes with activities of daily living, pain with passive range of motion and joint swelling.  Patient has evidence of poly dislocation by imaging studies. This condition presents safety issues increasing the risk of falls.  There is no current active infection.  Risks, benefits and expectations were discussed with the patient.  Risks including but not limited to the risk of anesthesia, blood clots, nerve damage, blood vessel damage, failure of the prosthesis, infection and up to and including death.  Patient understand the risks, benefits and expectations and wishes to proceed with surgery.   PCP: System, Provider Not In  D/C Plans:       Home   Post-op Meds:       No Rx given   Tranexamic Acid:      To be given - IV   Decadron:      Is to be given  FYI:      ASA  Norco  DME:   Pt already has equipment   PT:   OPPT Rx given   Patient Active Problem List   Diagnosis Date Noted  . Acute thoracic back pain   . Chest pain 12/10/2015  . Hypokalemia 12/10/2015  . Hypomagnesemia 12/10/2015   . Asthma due to seasonal allergies   . Anxiety   . Family history of multiple sclerosis 08/14/2015  . Benign paroxysmal positional vertigo 03/06/2015  . Other headache syndrome 03/06/2015  . Neck pain 03/06/2015  . Abnormal finding on MRI of brain 08/20/2014  . Anxiety state 08/20/2014  . Abnormal findings-skull or head 08/20/2014  . Optic neuritis 08/16/2014  . HTN (hypertension) 08/16/2014  . T2DM (type 2 diabetes mellitus) ([place]) 08/16/2014  . HLD (hyperlipidemia) 08/16/2014  . Eye pain   . Abnormal LFTs 04/30/2011  . Diabetes mellitus type 2, uncontrolled ([place]) 04/29/2011  . BP (high blood pressure) 04/29/2011  . Cannot sleep 04/29/2011  . 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 Diabetes mellitus, type 2 ([place]) 11/03/2008  . Type 2 diabetes mellitus ([place]) 11/03/2008  . 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  . Arthritis of knee, degenerative 06/03/2008  . Abnormal immunological findings in specimens from other organs, systems and tissues 06/03/2008   Past Medical History:  Diagnosis Date  . Anxiety   . Asthma due to seasonal allergies   . Depression   . Diabetes mellitus without complication ([place])   . High cholesterol   . Hypertension   . Malignant melanoma ([place])   . Obesity     Past Surgical History:  Procedure Laterality Date  . ABDOMINAL HYSTERECTOMY    . JOINT REPLACEMENT Bilateral    knee    No current facility-administered medications  for this encounter.    Current Outpatient Medications  Medication Sig Dispense Refill Last Dose  . aspirin EC 81 MG tablet Take 81 mg by mouth daily.   Taking  . atorvastatin (LIPITOR) 80 MG tablet Take 80 mg by mouth daily.     . Cholecalciferol (VITAMIN D3) 5000 units CAPS Take 5,000 Units by mouth daily.     . empagliflozin (JARDIANCE) 25 MG TABS tablet Take 25 mg by mouth daily.   Taking  . escitalopram (LEXAPRO) 20 MG tablet Take 20 mg by mouth daily.   Taking  . Exenatide ER (BYDUREON) 2 MG PEN Inject 2 mg into the skin every  Sunday.     . losartan-hydrochlorothiazide (HYZAAR) 100-25 MG per tablet Take 1 tablet by mouth daily.  2 Taking  . metFORMIN (GLUCOPHAGE) 1000 MG tablet Take 1,000 mg by mouth 2 (two) times daily with a meal.   Taking  . pantoprazole (PROTONIX) 40 MG tablet Take 1 tablet (40 mg total) by mouth daily. 30 tablet 3 Taking  . ALPRAZolam (XANAX) 0.5 MG tablet Take one tablet 30 min. piror to MRI.  May repeat once if needed.  May not drink alcohol, drive or operate dangerous equipment while taking. (Patient not taking: Reported on 11/03/2017) 2 tablet 0 Completed Course at Unknown time  . doxepin (SINEQUAN) 10 MG capsule Take 1 capsule (10 mg total) by mouth at bedtime. (Patient not taking: Reported on 11/03/2017) 30 capsule 11 Not Taking at Unknown time   Allergies  Allergen Reactions  . Aleve [Naproxen Sodium] Anaphylaxis and Other (See Comments)    Patient reports she tolerates ibuprofen.  . Codeine Itching and Other (See Comments)    jittery  . Penicillins Other (See Comments)    Has patient had a PCN reaction causing immediate rash, facial/tongue/throat swelling, SOB or lightheadedness with hypotension: No Has patient had a PCN reaction causing severe rash involving mucus membranes or skin necrosis: No Has patient had a PCN reaction that required hospitalization No Has patient had a PCN reaction occurring within the last 10 years: No If all of the above answers are "NO", then may proceed with Cephalosporin use.  . Ace Inhibitors Cough    Social History   Tobacco Use  . Smoking status: Former Smoker    Packs/day: 0.50    Years: 5.00    Pack years: 2.50    Types: Cigarettes    Last attempt to quit: 09/04/2004    Years since quitting: 13.1  . Smokeless tobacco: Never Used  Substance Use Topics  . Alcohol use: Yes    Alcohol/week: 0.0 standard drinks    Comment: Rare    Family History  Problem Relation Age of Onset  . Multiple sclerosis Mother   . Cirrhosis Father      Review of  Systems  Constitutional: Negative.   HENT: Negative.   Eyes: Negative.   Respiratory: Negative.   Cardiovascular: Negative.   Gastrointestinal: Negative.   Genitourinary: Negative.   Musculoskeletal: Positive for joint pain.  Skin: Negative.   Neurological: Negative.   Endo/Heme/Allergies: Negative.   Psychiatric/Behavioral: Positive for depression. The patient is nervous/anxious.      Objective:  Physical Exam  Constitutional: She is oriented to person, place, and time. She appears well-developed.  HENT:  Head: Normocephalic.  Eyes: Pupils are equal, round, and reactive to light.  Neck: Neck supple. No JVD present. No tracheal deviation present. No thyromegaly present.  Cardiovascular: Normal rate, regular rhythm and intact distal pulses.  Respiratory: Effort normal and breath sounds normal. No respiratory distress. She has no wheezes.  GI: Soft. There is no tenderness. There is no guarding.  Musculoskeletal:       Right knee: She exhibits decreased range of motion and swelling. She exhibits no ecchymosis, no deformity and no erythema. Tenderness found.  Lymphadenopathy:    She has no cervical adenopathy.  Neurological: She is alert and oriented to person, place, and time.  Skin: Skin is warm and dry.  Psychiatric: She has a normal mood and affect.    Labs:  Estimated body mass index is 35.79 kg/m as calculated from the following:   Height as of 03/18/16: 5\' 4"  (1.626 m).   Weight as of 03/18/16: 94.6 kg.  Imaging Review Plain radiographs demonstrate dislocated poly of the right knee(s). The overall alignment is neutral. The bone quality appears to be good for age and reported activity level.   Preoperative templating of the joint replacement has been completed, documented, and submitted to the Operating Room personnel in order to optimize intra-operative equipment management.   Assessment/Plan:  Dislocated poly of right UKR.   The patient history, physical  examination, clinical judgment of the provider and imaging studies are consistent with dislocated poly of the right knee(s), previous unicompartmental knee arthroplasty. Revision poly of medial unicompartmental  knee arthroplasty is deemed medically necessary. The treatment options including medical management, injection therapy, arthroscopy and revision arthroplasty were discussed at length. The risks and benefits of revision total knee arthroplasty were presented and reviewed. The risks due to aseptic loosening, infection, stiffness, patella tracking problems, thromboembolic complications and other imponderables were discussed. The patient acknowledged the explanation, agreed to proceed with the plan and consent was signed. Patient is being admitted for inpatient treatment for surgery, pain control, PT, OT, prophylactic antibiotics, VTE prophylaxis, progressive ambulation and ADL's and discharge planning.The patient is planning to be discharged home.     West Pugh Shalise Rosado   PA-C  11/05/2017, 3:23 PM

## 2017-11-06 ENCOUNTER — Encounter (HOSPITAL_COMMUNITY)
Admission: RE | Admit: 2017-11-06 | Discharge: 2017-11-06 | Disposition: A | Discharge status: Home / Self Care | Payer: BLUE CROSS/BLUE SHIELD | Source: Ambulatory Visit | Attending: Orthopedic Surgery | Admitting: Orthopedic Surgery

## 2017-11-06 ENCOUNTER — Encounter (HOSPITAL_COMMUNITY): Payer: Self-pay

## 2017-11-06 ENCOUNTER — Other Ambulatory Visit: Payer: Self-pay

## 2017-11-06 DIAGNOSIS — Z96652 Presence of left artificial knee joint: Secondary | ICD-10-CM | POA: Insufficient documentation

## 2017-11-06 DIAGNOSIS — M25561 Pain in right knee: Secondary | ICD-10-CM | POA: Insufficient documentation

## 2017-11-06 DIAGNOSIS — I10 Essential (primary) hypertension: Secondary | ICD-10-CM | POA: Diagnosis not present

## 2017-11-06 DIAGNOSIS — E119 Type 2 diabetes mellitus without complications: Secondary | ICD-10-CM | POA: Insufficient documentation

## 2017-11-06 DIAGNOSIS — Z01818 Encounter for other preprocedural examination: Secondary | ICD-10-CM | POA: Diagnosis not present

## 2017-11-06 HISTORY — DX: Unspecified optic neuritis: H46.9

## 2017-11-06 LAB — CBC
HEMATOCRIT: 43.1 % (ref 36.0–46.0)
Hemoglobin: 13.8 g/dL (ref 12.0–15.0)
MCH: 27.7 pg (ref 26.0–34.0)
MCHC: 32 g/dL (ref 30.0–36.0)
MCV: 86.4 fL (ref 80.0–100.0)
NRBC: 0 % (ref 0.0–0.2)
Platelets: 299 10*3/uL (ref 150–400)
RBC: 4.99 MIL/uL (ref 3.87–5.11)
RDW: 14.1 % (ref 11.5–15.5)
WBC: 9.9 10*3/uL (ref 4.0–10.5)

## 2017-11-06 LAB — COMPREHENSIVE METABOLIC PANEL
ALBUMIN: 4.5 g/dL (ref 3.5–5.0)
ALK PHOS: 72 U/L (ref 38–126)
ALT: 68 U/L — ABNORMAL HIGH (ref 0–44)
AST: 81 U/L — AB (ref 15–41)
Anion gap: 13 (ref 5–15)
BILIRUBIN TOTAL: 0.9 mg/dL (ref 0.3–1.2)
BUN: 24 mg/dL — AB (ref 8–23)
CO2: 27 mmol/L (ref 22–32)
Calcium: 9.6 mg/dL (ref 8.9–10.3)
Chloride: 99 mmol/L (ref 98–111)
Creatinine, Ser: 1.02 mg/dL — ABNORMAL HIGH (ref 0.44–1.00)
GFR calc Af Amer: >60 mL/min (ref >60)
GFR, EST NON AFRICAN AMERICAN: 58 mL/min — AB (ref >60)
GLUCOSE: 153 mg/dL — AB (ref 70–99)
Potassium: 3.6 mmol/L (ref 3.5–5.1)
Sodium: 139 mmol/L (ref 135–145)
TOTAL PROTEIN: 7.9 g/dL (ref 6.5–8.1)

## 2017-11-06 LAB — SURGICAL PCR SCREEN
MRSA, PCR: NEGATIVE
Staphylococcus aureus: NEGATIVE

## 2017-11-06 LAB — HEMOGLOBIN A1C
HEMOGLOBIN A1C: 6.9 % — AB (ref 4.8–5.6)
Mean Plasma Glucose: 151.33 mg/dL

## 2017-11-06 LAB — GLUCOSE, CAPILLARY: Glucose-Capillary: 160 mg/dL — ABNORMAL HIGH (ref 70–99)

## 2017-11-07 ENCOUNTER — Other Ambulatory Visit: Payer: Self-pay

## 2017-11-07 ENCOUNTER — Ambulatory Visit (HOSPITAL_COMMUNITY): Payer: BLUE CROSS/BLUE SHIELD | Admitting: Anesthesiology

## 2017-11-07 ENCOUNTER — Observation Stay (HOSPITAL_COMMUNITY)
Admission: RE | Admit: 2017-11-07 | Discharge: 2017-11-08 | Disposition: A | Discharge status: Home / Self Care | Payer: BLUE CROSS/BLUE SHIELD | Source: Ambulatory Visit | Attending: Orthopedic Surgery | Admitting: Orthopedic Surgery

## 2017-11-07 ENCOUNTER — Encounter (HOSPITAL_COMMUNITY): Payer: Self-pay | Admitting: Certified Registered Nurse Anesthetist

## 2017-11-07 ENCOUNTER — Encounter (HOSPITAL_COMMUNITY)
Admission: RE | Disposition: A | Discharge status: Home / Self Care | Payer: Self-pay | Source: Ambulatory Visit | Attending: Orthopedic Surgery

## 2017-11-07 DIAGNOSIS — E669 Obesity, unspecified: Secondary | ICD-10-CM | POA: Insufficient documentation

## 2017-11-07 DIAGNOSIS — Z7982 Long term (current) use of aspirin: Secondary | ICD-10-CM | POA: Insufficient documentation

## 2017-11-07 DIAGNOSIS — Z885 Allergy status to narcotic agent status: Secondary | ICD-10-CM | POA: Diagnosis not present

## 2017-11-07 DIAGNOSIS — T84022A Instability of internal right knee prosthesis, initial encounter: Principal | ICD-10-CM | POA: Insufficient documentation

## 2017-11-07 DIAGNOSIS — Y793 Surgical instruments, materials and orthopedic devices (including sutures) associated with adverse incidents: Secondary | ICD-10-CM | POA: Diagnosis not present

## 2017-11-07 DIAGNOSIS — Z7984 Long term (current) use of oral hypoglycemic drugs: Secondary | ICD-10-CM | POA: Diagnosis not present

## 2017-11-07 DIAGNOSIS — E7800 Pure hypercholesterolemia, unspecified: Secondary | ICD-10-CM | POA: Diagnosis not present

## 2017-11-07 DIAGNOSIS — Z6834 Body mass index (BMI) 34.0-34.9, adult: Secondary | ICD-10-CM | POA: Insufficient documentation

## 2017-11-07 DIAGNOSIS — M25461 Effusion, right knee: Secondary | ICD-10-CM | POA: Diagnosis not present

## 2017-11-07 DIAGNOSIS — J45909 Unspecified asthma, uncomplicated: Secondary | ICD-10-CM | POA: Diagnosis not present

## 2017-11-07 DIAGNOSIS — F329 Major depressive disorder, single episode, unspecified: Secondary | ICD-10-CM | POA: Insufficient documentation

## 2017-11-07 DIAGNOSIS — Z880 Allergy status to penicillin: Secondary | ICD-10-CM | POA: Diagnosis not present

## 2017-11-07 DIAGNOSIS — Z96659 Presence of unspecified artificial knee joint: Secondary | ICD-10-CM

## 2017-11-07 DIAGNOSIS — Z96652 Presence of left artificial knee joint: Secondary | ICD-10-CM

## 2017-11-07 DIAGNOSIS — Z886 Allergy status to analgesic agent status: Secondary | ICD-10-CM | POA: Insufficient documentation

## 2017-11-07 DIAGNOSIS — Z87892 Personal history of anaphylaxis: Secondary | ICD-10-CM | POA: Diagnosis not present

## 2017-11-07 DIAGNOSIS — Z87891 Personal history of nicotine dependence: Secondary | ICD-10-CM | POA: Diagnosis not present

## 2017-11-07 DIAGNOSIS — Z888 Allergy status to other drugs, medicaments and biological substances status: Secondary | ICD-10-CM | POA: Diagnosis not present

## 2017-11-07 DIAGNOSIS — Z96653 Presence of artificial knee joint, bilateral: Secondary | ICD-10-CM | POA: Diagnosis not present

## 2017-11-07 DIAGNOSIS — F419 Anxiety disorder, unspecified: Secondary | ICD-10-CM | POA: Insufficient documentation

## 2017-11-07 DIAGNOSIS — Z85820 Personal history of malignant melanoma of skin: Secondary | ICD-10-CM | POA: Insufficient documentation

## 2017-11-07 DIAGNOSIS — Z79899 Other long term (current) drug therapy: Secondary | ICD-10-CM | POA: Insufficient documentation

## 2017-11-07 DIAGNOSIS — I10 Essential (primary) hypertension: Secondary | ICD-10-CM | POA: Diagnosis not present

## 2017-11-07 DIAGNOSIS — E119 Type 2 diabetes mellitus without complications: Secondary | ICD-10-CM | POA: Insufficient documentation

## 2017-11-07 HISTORY — PX: I&D KNEE WITH POLY EXCHANGE: SHX5024

## 2017-11-07 LAB — TYPE AND SCREEN
ABO/RH(D): B NEG
Antibody Screen: NEGATIVE

## 2017-11-07 LAB — GLUCOSE, CAPILLARY
GLUCOSE-CAPILLARY: 108 mg/dL — AB (ref 70–99)
Glucose-Capillary: 125 mg/dL — ABNORMAL HIGH (ref 70–99)
Glucose-Capillary: 381 mg/dL — ABNORMAL HIGH (ref 70–99)

## 2017-11-07 SURGERY — IRRIGATION AND DEBRIDEMENT KNEE WITH POLY EXCHANGE
Anesthesia: Spinal | Site: Knee | Laterality: Right

## 2017-11-07 MED ORDER — CANAGLIFLOZIN 100 MG PO TABS
100.0000 mg | ORAL_TABLET | Freq: Every day | ORAL | Status: DC
  Administered 2017-11-08: 100 mg via ORAL
  Filled 2017-11-07: qty 1

## 2017-11-07 MED ORDER — HYDROMORPHONE HCL 1 MG/ML IJ SOLN: 0.5000 mg | INTRAMUSCULAR | Status: DC | PRN

## 2017-11-07 MED ORDER — FERROUS SULFATE 325 (65 FE) MG PO TABS
325.0000 mg | ORAL_TABLET | Freq: Two times a day (BID) | ORAL | Status: DC
  Administered 2017-11-08: 325 mg via ORAL
  Filled 2017-11-07 (×2): qty 1

## 2017-11-07 MED ORDER — POLYETHYLENE GLYCOL 3350 17 G PO PACK
17.0000 g | PACK | Freq: Two times a day (BID) | ORAL | Status: DC
  Administered 2017-11-08: 17 g via ORAL
  Filled 2017-11-07 (×2): qty 1

## 2017-11-07 MED ORDER — LOSARTAN POTASSIUM-HCTZ 100-25 MG PO TABS: 1.0000 | ORAL_TABLET | Freq: Every day | ORAL | Status: DC

## 2017-11-07 MED ORDER — BISACODYL 10 MG RE SUPP: 10.0000 mg | Freq: Every day | RECTAL | Status: DC | PRN

## 2017-11-07 MED ORDER — LOSARTAN POTASSIUM 50 MG PO TABS
100.0000 mg | ORAL_TABLET | Freq: Every day | ORAL | Status: DC
  Filled 2017-11-07: qty 2

## 2017-11-07 MED ORDER — PROPOFOL 10 MG/ML IV BOLUS
INTRAVENOUS | Status: AC
  Filled 2017-11-07: qty 60

## 2017-11-07 MED ORDER — DEXAMETHASONE SODIUM PHOSPHATE 10 MG/ML IJ SOLN
10.0000 mg | Freq: Once | INTRAMUSCULAR | Status: AC
  Administered 2017-11-08: 10 mg via INTRAVENOUS
  Filled 2017-11-07: qty 1

## 2017-11-07 MED ORDER — SODIUM CHLORIDE 0.9 % IR SOLN
Status: DC | PRN
  Administered 2017-11-07: 1000 mL

## 2017-11-07 MED ORDER — CHLORHEXIDINE GLUCONATE 4 % EX LIQD: 60.0000 mL | Freq: Once | CUTANEOUS | Status: DC

## 2017-11-07 MED ORDER — BUPIVACAINE IN DEXTROSE 0.75-8.25 % IT SOLN
INTRATHECAL | Status: DC | PRN
  Administered 2017-11-07: 1.6 mL via INTRATHECAL

## 2017-11-07 MED ORDER — PANTOPRAZOLE SODIUM 40 MG PO TBEC
40.0000 mg | DELAYED_RELEASE_TABLET | Freq: Every day | ORAL | Status: DC
  Administered 2017-11-08: 40 mg via ORAL
  Filled 2017-11-07: qty 1

## 2017-11-07 MED ORDER — CLONIDINE HCL (ANALGESIA) 100 MCG/ML EP SOLN
EPIDURAL | Status: DC | PRN
  Administered 2017-11-07: 50 ug

## 2017-11-07 MED ORDER — ACETAMINOPHEN 325 MG PO TABS: 325.0000 mg | ORAL_TABLET | Freq: Four times a day (QID) | ORAL | Status: DC | PRN

## 2017-11-07 MED ORDER — INSULIN ASPART 100 UNIT/ML ~~LOC~~ SOLN
0.0000 [IU] | Freq: Three times a day (TID) | SUBCUTANEOUS | Status: DC
  Administered 2017-11-08: 2 [IU] via SUBCUTANEOUS
  Administered 2017-11-08: 3 [IU] via SUBCUTANEOUS

## 2017-11-07 MED ORDER — PHENOL 1.4 % MT LIQD
1.0000 | OROMUCOSAL | Status: DC | PRN
  Filled 2017-11-07: qty 177

## 2017-11-07 MED ORDER — METFORMIN HCL 500 MG PO TABS
1000.0000 mg | ORAL_TABLET | Freq: Two times a day (BID) | ORAL | Status: DC
  Administered 2017-11-08: 1000 mg via ORAL
  Filled 2017-11-07: qty 2

## 2017-11-07 MED ORDER — ROPIVACAINE HCL 7.5 MG/ML IJ SOLN
INTRAMUSCULAR | Status: DC | PRN
  Administered 2017-11-07: 20 mL via PERINEURAL

## 2017-11-07 MED ORDER — LIDOCAINE 2% (20 MG/ML) 5 ML SYRINGE
INTRAMUSCULAR | Status: AC
  Filled 2017-11-07: qty 5

## 2017-11-07 MED ORDER — DEXAMETHASONE SODIUM PHOSPHATE 10 MG/ML IJ SOLN
INTRAMUSCULAR | Status: AC
  Filled 2017-11-07: qty 1

## 2017-11-07 MED ORDER — ASPIRIN 81 MG PO CHEW
81.0000 mg | CHEWABLE_TABLET | Freq: Two times a day (BID) | ORAL | Status: DC
  Administered 2017-11-07 – 2017-11-08 (×2): 81 mg via ORAL
  Filled 2017-11-07 (×2): qty 1

## 2017-11-07 MED ORDER — FENTANYL CITRATE (PF) 100 MCG/2ML IJ SOLN
50.0000 ug | INTRAMUSCULAR | Status: DC
  Administered 2017-11-07: 50 ug via INTRAVENOUS
  Filled 2017-11-07: qty 2

## 2017-11-07 MED ORDER — DOCUSATE SODIUM 100 MG PO CAPS
100.0000 mg | ORAL_CAPSULE | Freq: Two times a day (BID) | ORAL | Status: DC
  Administered 2017-11-07 – 2017-11-08 (×2): 100 mg via ORAL
  Filled 2017-11-07 (×2): qty 1

## 2017-11-07 MED ORDER — DEXAMETHASONE SODIUM PHOSPHATE 10 MG/ML IJ SOLN
10.0000 mg | Freq: Once | INTRAMUSCULAR | Status: AC
  Administered 2017-11-07: 10 mg via INTRAVENOUS

## 2017-11-07 MED ORDER — HYDROCHLOROTHIAZIDE 25 MG PO TABS
25.0000 mg | ORAL_TABLET | Freq: Every day | ORAL | Status: DC
  Filled 2017-11-07: qty 1

## 2017-11-07 MED ORDER — TRANEXAMIC ACID-NACL 1000-0.7 MG/100ML-% IV SOLN
1000.0000 mg | INTRAVENOUS | Status: AC
  Administered 2017-11-07: 1000 mg via INTRAVENOUS
  Filled 2017-11-07: qty 100

## 2017-11-07 MED ORDER — SODIUM CHLORIDE 0.9 % IV SOLN
INTRAVENOUS | Status: DC
  Administered 2017-11-07: 20:00:00 via INTRAVENOUS

## 2017-11-07 MED ORDER — CEFAZOLIN SODIUM-DEXTROSE 2-4 GM/100ML-% IV SOLN
2.0000 g | INTRAVENOUS | Status: AC
  Administered 2017-11-07: 2 g via INTRAVENOUS
  Filled 2017-11-07: qty 100

## 2017-11-07 MED ORDER — DIPHENHYDRAMINE HCL 12.5 MG/5ML PO ELIX: 12.5000 mg | ORAL_SOLUTION | ORAL | Status: DC | PRN

## 2017-11-07 MED ORDER — FENTANYL CITRATE (PF) 100 MCG/2ML IJ SOLN
INTRAMUSCULAR | Status: AC
  Filled 2017-11-07: qty 2

## 2017-11-07 MED ORDER — HYDROCODONE-ACETAMINOPHEN 5-325 MG PO TABS: 1.0000 | ORAL_TABLET | ORAL | Status: DC | PRN

## 2017-11-07 MED ORDER — PROPOFOL 10 MG/ML IV BOLUS
INTRAVENOUS | Status: DC | PRN
  Administered 2017-11-07: 20 mg via INTRAVENOUS

## 2017-11-07 MED ORDER — HYDROMORPHONE HCL 1 MG/ML IJ SOLN: 0.2500 mg | INTRAMUSCULAR | Status: DC | PRN

## 2017-11-07 MED ORDER — TRANEXAMIC ACID-NACL 1000-0.7 MG/100ML-% IV SOLN
1000.0000 mg | Freq: Once | INTRAVENOUS | Status: AC
  Administered 2017-11-07: 1000 mg via INTRAVENOUS
  Filled 2017-11-07: qty 100

## 2017-11-07 MED ORDER — ONDANSETRON HCL 4 MG/2ML IJ SOLN: 4.0000 mg | Freq: Four times a day (QID) | INTRAMUSCULAR | Status: DC | PRN

## 2017-11-07 MED ORDER — METOCLOPRAMIDE HCL 5 MG/ML IJ SOLN: 5.0000 mg | Freq: Three times a day (TID) | INTRAMUSCULAR | Status: DC | PRN

## 2017-11-07 MED ORDER — ALUM & MAG HYDROXIDE-SIMETH 200-200-20 MG/5ML PO SUSP: 15.0000 mL | ORAL | Status: DC | PRN

## 2017-11-07 MED ORDER — MIDAZOLAM HCL 2 MG/2ML IJ SOLN
1.0000 mg | INTRAMUSCULAR | Status: DC
  Administered 2017-11-07: 1 mg via INTRAVENOUS
  Filled 2017-11-07: qty 2

## 2017-11-07 MED ORDER — TRAMADOL HCL 50 MG PO TABS: 50.0000 mg | ORAL_TABLET | Freq: Four times a day (QID) | ORAL | Status: DC | PRN

## 2017-11-07 MED ORDER — ONDANSETRON HCL 4 MG PO TABS: 4.0000 mg | ORAL_TABLET | Freq: Four times a day (QID) | ORAL | Status: DC | PRN

## 2017-11-07 MED ORDER — ONDANSETRON HCL 4 MG/2ML IJ SOLN
INTRAMUSCULAR | Status: AC
  Filled 2017-11-07: qty 2

## 2017-11-07 MED ORDER — LACTATED RINGERS IV SOLN
Freq: Once | INTRAVENOUS | Status: AC
  Administered 2017-11-07 (×2): via INTRAVENOUS

## 2017-11-07 MED ORDER — METOCLOPRAMIDE HCL 5 MG PO TABS: 5.0000 mg | ORAL_TABLET | Freq: Three times a day (TID) | ORAL | Status: DC | PRN

## 2017-11-07 MED ORDER — MENTHOL 3 MG MT LOZG: 1.0000 | LOZENGE | OROMUCOSAL | Status: DC | PRN

## 2017-11-07 MED ORDER — ATORVASTATIN CALCIUM 40 MG PO TABS
80.0000 mg | ORAL_TABLET | Freq: Every day | ORAL | Status: DC
  Administered 2017-11-07 – 2017-11-08 (×2): 80 mg via ORAL
  Filled 2017-11-07 (×2): qty 2

## 2017-11-07 MED ORDER — PROPOFOL 500 MG/50ML IV EMUL
INTRAVENOUS | Status: DC | PRN
  Administered 2017-11-07: 50 ug/kg/min via INTRAVENOUS

## 2017-11-07 MED ORDER — CEFAZOLIN SODIUM-DEXTROSE 2-4 GM/100ML-% IV SOLN
2.0000 g | Freq: Four times a day (QID) | INTRAVENOUS | Status: AC
  Administered 2017-11-07 – 2017-11-08 (×2): 2 g via INTRAVENOUS
  Filled 2017-11-07 (×2): qty 100

## 2017-11-07 MED ORDER — METHOCARBAMOL 500 MG PO TABS: 500.0000 mg | ORAL_TABLET | Freq: Four times a day (QID) | ORAL | Status: DC | PRN

## 2017-11-07 MED ORDER — ONDANSETRON HCL 4 MG/2ML IJ SOLN
INTRAMUSCULAR | Status: DC | PRN
  Administered 2017-11-07: 4 mg via INTRAVENOUS

## 2017-11-07 MED ORDER — PROMETHAZINE HCL 25 MG/ML IJ SOLN: 6.2500 mg | INTRAMUSCULAR | Status: DC | PRN

## 2017-11-07 MED ORDER — METHOCARBAMOL 500 MG IVPB - SIMPLE MED
500.0000 mg | Freq: Four times a day (QID) | INTRAVENOUS | Status: DC | PRN
  Filled 2017-11-07: qty 50

## 2017-11-07 MED ORDER — ESCITALOPRAM OXALATE 20 MG PO TABS
20.0000 mg | ORAL_TABLET | Freq: Every day | ORAL | Status: DC
  Administered 2017-11-08: 20 mg via ORAL
  Filled 2017-11-07: qty 1

## 2017-11-07 MED ORDER — FENTANYL CITRATE (PF) 100 MCG/2ML IJ SOLN
INTRAMUSCULAR | Status: DC | PRN
  Administered 2017-11-07: 50 ug via INTRAVENOUS
  Administered 2017-11-07 (×2): 25 ug via INTRAVENOUS

## 2017-11-07 MED ORDER — HYDROCODONE-ACETAMINOPHEN 7.5-325 MG PO TABS
1.0000 | ORAL_TABLET | ORAL | Status: DC | PRN
  Administered 2017-11-07: 1 via ORAL
  Administered 2017-11-08 (×3): 2 via ORAL
  Filled 2017-11-07: qty 1
  Filled 2017-11-07 (×3): qty 2

## 2017-11-07 MED ORDER — MAGNESIUM CITRATE PO SOLN: 1.0000 | Freq: Once | ORAL | Status: DC | PRN

## 2017-11-07 SURGICAL SUPPLY — 29 items
BANDAGE ACE 6X5 VEL STRL LF (GAUZE/BANDAGES/DRESSINGS) ×3 IMPLANT
COVER SURGICAL LIGHT HANDLE (MISCELLANEOUS) ×3 IMPLANT
COVER WAND RF STERILE (DRAPES) ×3 IMPLANT
DERMABOND ADVANCED (GAUZE/BANDAGES/DRESSINGS) ×2
DERMABOND ADVANCED .7 DNX12 (GAUZE/BANDAGES/DRESSINGS) ×1 IMPLANT
DRAPE U-SHAPE 47X51 STRL (DRAPES) ×3 IMPLANT
DRESSING AQUACEL AG SP 3.5X6 (GAUZE/BANDAGES/DRESSINGS) IMPLANT
DRSG AQUACEL AG ADV 3.5X10 (GAUZE/BANDAGES/DRESSINGS) ×3 IMPLANT
DRSG AQUACEL AG SP 3.5X6 (GAUZE/BANDAGES/DRESSINGS)
DURAPREP 26ML APPLICATOR (WOUND CARE) ×3 IMPLANT
ELECT REM PT RETURN 15FT ADLT (MISCELLANEOUS) ×3 IMPLANT
GLOVE BIOGEL PI IND STRL 7.5 (GLOVE) ×1 IMPLANT
GLOVE BIOGEL PI IND STRL 8.5 (GLOVE) ×1 IMPLANT
GLOVE BIOGEL PI INDICATOR 7.5 (GLOVE) ×2
GLOVE BIOGEL PI INDICATOR 8.5 (GLOVE) ×2
GLOVE ECLIPSE 8.0 STRL XLNG CF (GLOVE) ×3 IMPLANT
GLOVE ORTHO TXT STRL SZ7.5 (GLOVE) ×3 IMPLANT
GOWN STRL REUS W/TWL LRG LVL3 (GOWN DISPOSABLE) ×3 IMPLANT
GOWN STRL REUS W/TWL XL LVL3 (GOWN DISPOSABLE) ×3 IMPLANT
HANDPIECE INTERPULSE COAX TIP (DISPOSABLE) ×2
KNEE MENISC BRG SML RT 9MM THK (Miscellaneous) ×3 IMPLANT
MANIFOLD NEPTUNE II (INSTRUMENTS) ×3 IMPLANT
PACK TOTAL KNEE CUSTOM (KITS) ×3 IMPLANT
SET HNDPC FAN SPRY TIP SCT (DISPOSABLE) ×1 IMPLANT
SUT MNCRL AB 3-0 PS2 18 (SUTURE) ×3 IMPLANT
SUT VIC AB 1 CT1 36 (SUTURE) ×3 IMPLANT
SUT VIC AB 2-0 CT1 27 (SUTURE) ×4
SUT VIC AB 2-0 CT1 TAPERPNT 27 (SUTURE) ×2 IMPLANT
WRAP KNEE MAXI GEL POST OP (GAUZE/BANDAGES/DRESSINGS) ×3 IMPLANT

## 2017-11-07 NOTE — Op Note (Signed)
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  FINDINGS:  Please see body of paragraph for intraoperative findings.  PROCEDURE:  Revision right partial knee arthroplasty; specifically, the polyethylene bearing surface upsized to a size 9 mm polyethylene to match the small femur on this right knee.  SURGEON:  Paralee Cancel, MD  ASSISTANT:  Danae Orleans, PA-C  Note that Mr. Guinevere Scarlet was present for the entirety of the case from preoperative positioning, perioperative management of the operative extremity, general facilitation of the case and primary wound closure.  ANESTHESIA:  Spinal.  SPECIMENS:  None.  COMPLICATIONS:  None apparent.  ESTIMATED BLOOD LOSS:  Less than 100 mL.  TOURNIQUET TIME:  As noted.  INDICATIONS FOR PROCEDURE:  The patient is a 62 year old female 9 years after her right partial knee arthroplasty.  She presented to the office on Friday with a reported several month episodes of catching with the right knee and it giving away.  She woke  up prior to her evaluation on Friday with a locked knee and the ability to bear weight.  Based on this, she was brought to the office.  Radiographs including AP and lateral of her right knee showed that her polyethylene had subluxated from the joint  into the anterior aspect.  I was out of town at the time and x-rays were sent to me.  Upon review, she was scheduled for surgery.  Risks and benefits were discussed and necessity of the procedure were discussed in terms of revising this to a polyethylene  as opposed to a total knee arthroplasty, and the standard risk of infection, DVT,  and need for future surgery reviewed.  Consent was obtained.  PROCEDURE IN DETAIL:  The patient was brought to the operative theater.  Once adequate anesthesia and preoperative antibiotics, Ancef, were administered, she was positioned supine.  Her right leg was placed in a leg holder.  A right thigh tourniquet was  placed.  The right lower extremity was then positioned in the Oxford leg holder to allow for 120 degrees of flexion.  A timeout was performed identifying the patient, the planned procedure and extremity.  The right lower extremity was prepped and draped  in sterile fashion.  The leg was exsanguinated, tourniquet elevated to 250 mmHg.  A portion of her old incision was excised, exposing the retinacular tissues.  A medial arthrotomy was then made.  At this point, the polyethylene was readily visible and  was removed.  I was very surprised to see the appearance of the polyethylene as it was significantly worn, even down to the metal bar.  This would not be expected 9 years out.  However, it is difficult to know exactly how this could have come to be.   There could have been technical variables that led to this.  However, I did not feel that it was worth converting her to a total knee arthroplasty as her patellofemoral joint appeared to be relatively intact, as did the lateral compartment.  The  polyethylene ball was exposed, and there was evidence of some reactive type metal stained synovium.  Thus, we performed an excisional debridement of synovium in his right knee.  We then irrigated with normal saline solution.  I then used the  [employer]  Oxford trial components and decided to go from a size 4 polyethylene all the way up to a 9.  This seemed a little bit unusual as well given the fact that her MCL was intact, and her ACL was intact.  Given these findings, the final 9 mm insert to match  the small finger on the right knee was opened.  It was then snapped into place.  We then reexamined the knee and  reirrigated the knee.  Extensor mechanism was then reapproximated using a #1 Vicryl.  The remainder of the wound was closed with 2-0 Vicryl  and a running Monocryl stitch.  Then, knee was then cleaned, dried and dressed sterilely using surgical glue and Aquacel dressing.  She was then brought to the recovery room in stable condition, tolerating the procedure well.  Findings were reviewed with  her daughter.  She will be admitted overnight for observation for antibiotics.  Otherwise, we will encourage activity as tolerated with physical therapy as needed.  LN/NUANCE  D:11/07/2017 T:11/07/2017 JOB:[id number]

## 2017-11-07 NOTE — Anesthesia Postprocedure Evaluation (Signed)
Anesthesia Post Note  Patient: Mariah Torres  Procedure(s) Performed: Right unicompartmental poly revision (Right Knee)     Patient location during evaluation: PACU Anesthesia Type: Spinal Level of consciousness: oriented and awake and alert Pain management: pain level controlled Vital Signs Assessment: post-procedure vital signs reviewed and stable Respiratory status: spontaneous breathing, respiratory function stable and patient connected to nasal cannula oxygen Cardiovascular status: blood pressure returned to baseline and stable Postop Assessment: no headache, no backache and no apparent nausea or vomiting Anesthetic complications: no    Last Vitals:  Vitals:   11/07/17 1800 11/07/17 1815  BP: 98/69 109/70  Pulse: 72 73  Resp: 18 14  Temp:    SpO2: 98% 95%    Last Pain:  Vitals:   11/07/17 1815  TempSrc:   PainSc: 0-No pain    LLE Motor Response: Purposeful movement (11/07/17 1815)   RLE Motor Response: Purposeful movement (11/07/17 1815)   L Sensory Level: S1-Sole of foot, small toes (11/07/17 1815) R Sensory Level: S1-Sole of foot, small toes (11/07/17 1815)  [name] S

## 2017-11-07 NOTE — Anesthesia Preprocedure Evaluation (Signed)
Anesthesia Evaluation  Patient identified by MRN, date of birth, ID band Patient awake    Reviewed: Allergy & Precautions, NPO status , Patient's Chart, lab work & pertinent test results  Airway Mallampati: II  TM Distance: >3 FB Neck ROM: Full    Dental no notable dental hx.    Pulmonary neg pulmonary ROS, former smoker,    Pulmonary exam normal breath sounds clear to auscultation       Cardiovascular hypertension, Normal cardiovascular exam Rhythm:Regular Rate:Normal     Neuro/Psych negative neurological ROS  negative psych ROS   GI/Hepatic negative GI ROS, Neg liver ROS,   Endo/Other  diabetes  Renal/GU negative Renal ROS  negative genitourinary   Musculoskeletal negative musculoskeletal ROS (+)   Abdominal   Peds negative pediatric ROS (+)  Hematology negative hematology ROS (+)   Anesthesia Other Findings   Reproductive/Obstetrics negative OB ROS                             Anesthesia Physical Anesthesia Plan  ASA: II  Anesthesia Plan: Spinal   Post-op Pain Management:    Induction: Intravenous  PONV Risk Score and Plan: 2 and Ondansetron and Treatment may vary due to age or medical condition  Airway Management Planned: Simple Face Mask  Additional Equipment:   Intra-op Plan:   Post-operative Plan:   Informed Consent: I have reviewed the patients History and Physical, chart, labs and discussed the procedure including the risks, benefits and alternatives for the proposed anesthesia with the patient or authorized representative who has indicated his/her understanding and acceptance.   Dental advisory given  Plan Discussed with: CRNA and Surgeon  Anesthesia Plan Comments:         Anesthesia Quick Evaluation

## 2017-11-07 NOTE — Interval H&P Note (Signed)
History and Physical Interval Note:  11/07/2017 3:03 PM  Mariah Torres  has presented today for surgery, with the diagnosis of Right failed right unicompartimental  The various methods of treatment have been discussed with the patient and family. After consideration of risks, benefits and other options for treatment, the patient has consented to  Procedure(s) with comments: Right unicompartimental poly revision (Right) - 90 mins as a surgical intervention .  The patient's history has been reviewed, patient examined, no change in status, stable for surgery.  I have reviewed the patient's chart and labs.  Questions were answered to the patient's satisfaction.     Mauri Pole

## 2017-11-07 NOTE — Brief Op Note (Signed)
11/07/2017  9:23 PM  PATIENT:  Mariah Torres  62 y.o. female  PRE-OPERATIVE DIAGNOSIS:  Right failed  unicompartimental knee arthroplasty  POST-OPERATIVE DIAGNOSIS:  Right failed  unicompartimental knee arthroplasty  PROCEDURE:  Procedure(s) with comments: Right unicompartmental poly revision (Right) - 90 mins  SURGEON:  Surgeon(s) and Role:    Paralee Cancel, MD - Primary  PHYSICIAN ASSISTANT: Danae Orleans, PA-C    ANESTHESIA:   spinal  EBL:  <100cc  BLOOD ADMINISTERED:none  DRAINS: none   LOCAL MEDICATIONS USED:  NONE  SPECIMEN:  No Specimen  DISPOSITION OF SPECIMEN:  N/A  COUNTS:  YES  TOURNIQUET:   Total Tourniquet Time Documented: Thigh (Right) - 23 minutes Total: Thigh (Right) - 23 minutes   DICTATION: .Other Dictation: Dictation Number [id number]  PLAN OF CARE: Admit for overnight observation  PATIENT DISPOSITION:  PACU - hemodynamically stable.   Delay start of Pharmacological VTE agent (>24hrs) due to surgical blood loss or risk of bleeding: no

## 2017-11-07 NOTE — Anesthesia Procedure Notes (Signed)
Spinal  Patient location during procedure: OR Start time: 11/07/2017 4:18 PM End time: 11/07/2017 4:22 PM Staffing Anesthesiologist: Myrtie Soman, MD Resident/CRNA: Raenette Rover, CRNA Performed: resident/CRNA  Preanesthetic Checklist Completed: patient identified, site marked, surgical consent, pre-op evaluation, timeout performed, IV checked, risks and benefits discussed and monitors and equipment checked Spinal Block Patient position: sitting Prep: DuraPrep Patient monitoring: blood pressure, continuous pulse ox and heart rate Approach: midline Location: L3-4 Injection technique: single-shot Needle Needle type: Spinocan  Needle gauge: 24 G Assessment Sensory level: T6

## 2017-11-07 NOTE — Anesthesia Procedure Notes (Signed)
Anesthesia Regional Block: Adductor canal block   Pre-Anesthetic Checklist: ,, timeout performed, Correct Patient, Correct Site, Correct Laterality, Correct Procedure, Correct Position, site marked, Risks and benefits discussed,  Surgical consent,  Pre-op evaluation,  At surgeon's request and post-op pain management  Laterality: Right  Prep: chloraprep       Needles:  Injection technique: Single-shot  Needle Type: Echogenic Needle     Needle Length: 9cm      Additional Needles:   Procedures:,,,, ultrasound used (permanent image in chart),,,,  Narrative:  Start time: 11/07/2017 3:38 PM End time: 11/07/2017 3:44 PM Injection made incrementally with aspirations every 5 mL.  Performed by: Personally  Anesthesiologist: Myrtie Soman, MD  Additional Notes: Patient tolerated the procedure well without complications

## 2017-11-07 NOTE — Progress Notes (Signed)
Assisted Dr.George Rose with Right knee adductor canal  block. Side rails up, monitors on throughout procedure. See vital signs in flow sheet. Tolerated Procedure well.

## 2017-11-07 NOTE — Anesthesia Procedure Notes (Signed)
Anesthesia Procedure Image    

## 2017-11-07 NOTE — Transfer of Care (Signed)
Immediate Anesthesia Transfer of Care Note  Patient: Mariah Torres  Procedure(s) Performed: Right unicompartmental poly revision (Right Knee)  Patient Location: PACU  Anesthesia Type:Spinal and MAC combined with regional for post-op pain  Level of Consciousness: awake, alert , oriented and patient cooperative  Airway & Oxygen Therapy: Patient Spontanous Breathing and Patient connected to face mask oxygen  Post-op Assessment: Report given to RN and Post -op Vital signs reviewed and stable  Post vital signs: Reviewed and stable  Last Vitals:  Vitals Value Taken Time  BP 101/67 11/07/2017  5:45 PM  Temp 36.4 C 11/07/2017  5:44 PM  Pulse 82 11/07/2017  5:46 PM  Resp 23 11/07/2017  5:46 PM  SpO2 98 % 11/07/2017  5:46 PM  Vitals shown include unvalidated device data.  Last Pain:  Vitals:   11/07/17 1602  TempSrc:   PainSc: 0-No pain         Complications: No apparent anesthesia complications

## 2017-11-07 NOTE — Discharge Instructions (Signed)

## 2017-11-08 ENCOUNTER — Encounter (HOSPITAL_COMMUNITY): Payer: Self-pay | Admitting: Orthopedic Surgery

## 2017-11-08 DIAGNOSIS — E669 Obesity, unspecified: Secondary | ICD-10-CM | POA: Diagnosis present

## 2017-11-08 DIAGNOSIS — T84022A Instability of internal right knee prosthesis, initial encounter: Secondary | ICD-10-CM | POA: Diagnosis not present

## 2017-11-08 LAB — CBC
HCT: 35 % — ABNORMAL LOW (ref 36.0–46.0)
Hemoglobin: 11.3 g/dL — ABNORMAL LOW (ref 12.0–15.0)
MCH: 28.3 pg (ref 26.0–34.0)
MCHC: 32.3 g/dL (ref 30.0–36.0)
MCV: 87.7 fL (ref 80.0–100.0)
PLATELETS: 213 10*3/uL (ref 150–400)
RBC: 3.99 MIL/uL (ref 3.87–5.11)
RDW: 14 % (ref 11.5–15.5)
WBC: 7.4 10*3/uL (ref 4.0–10.5)
nRBC: 0 % (ref 0.0–0.2)

## 2017-11-08 LAB — BASIC METABOLIC PANEL
Anion gap: 6 (ref 5–15)
BUN: 19 mg/dL (ref 8–23)
CO2: 28 mmol/L (ref 22–32)
CREATININE: 0.72 mg/dL (ref 0.44–1.00)
Calcium: 9.1 mg/dL (ref 8.9–10.3)
Chloride: 105 mmol/L (ref 98–111)
GFR calc Af Amer: >60 mL/min (ref >60)
Glucose, Bld: 163 mg/dL — ABNORMAL HIGH (ref 70–99)
Potassium: 3.2 mmol/L — ABNORMAL LOW (ref 3.5–5.1)
SODIUM: 139 mmol/L (ref 135–145)

## 2017-11-08 LAB — GLUCOSE, CAPILLARY
GLUCOSE-CAPILLARY: 143 mg/dL — AB (ref 70–99)
GLUCOSE-CAPILLARY: 167 mg/dL — AB (ref 70–99)

## 2017-11-08 MED ORDER — POLYETHYLENE GLYCOL 3350 17 G PO PACK: 17.0000 g | PACK | Freq: Two times a day (BID) | ORAL | 0 refills | Status: DC

## 2017-11-08 MED ORDER — HYDROCODONE-ACETAMINOPHEN 7.5-325 MG PO TABS: 1.0000 | ORAL_TABLET | ORAL | 0 refills | Status: DC | PRN

## 2017-11-08 MED ORDER — METHOCARBAMOL 500 MG PO TABS: 500.0000 mg | ORAL_TABLET | Freq: Four times a day (QID) | ORAL | 0 refills | Status: DC | PRN

## 2017-11-08 MED ORDER — INSULIN ASPART 100 UNIT/ML ~~LOC~~ SOLN
5.0000 [IU] | Freq: Once | SUBCUTANEOUS | Status: AC
  Administered 2017-11-08: 5 [IU] via SUBCUTANEOUS

## 2017-11-08 MED ORDER — DOCUSATE SODIUM 100 MG PO CAPS: 100.0000 mg | ORAL_CAPSULE | Freq: Two times a day (BID) | ORAL | 0 refills | Status: DC

## 2017-11-08 MED ORDER — FERROUS SULFATE 325 (65 FE) MG PO TABS: 325.0000 mg | ORAL_TABLET | Freq: Three times a day (TID) | ORAL | 3 refills | Status: DC

## 2017-11-08 MED ORDER — ASPIRIN 81 MG PO CHEW: 81.0000 mg | CHEWABLE_TABLET | Freq: Two times a day (BID) | ORAL | 0 refills | Status: AC

## 2017-11-08 NOTE — Progress Notes (Signed)
     Subjective: 1 Day Post-Op Procedure(s) (LRB): Right unicompartmental poly revision (Right)   Patient reports pain as mild, pain controlled. No events throughout the night. The procedure and findings were discussed.  Ready to be discharged home.    Objective:   VITALS:   Vitals:   11/08/17 0228 11/08/17 0645  BP: 98/60 116/77  Pulse: 99 80  Resp: 14 16  Temp: 98.1 F (36.7 C) 98.2 F (36.8 C)  SpO2: 93% 96%    Dorsiflexion/Plantar flexion intact Incision: dressing C/D/I No cellulitis present Compartment soft  LABS Recent Labs    11/06/17 1219 11/08/17 0517  HGB 13.8 11.3*  HCT 43.1 35.0*  WBC 9.9 7.4  PLT 299 213    Recent Labs    11/06/17 1219 11/08/17 0517  NA 139 139  K 3.6 3.2*  BUN 24* 19  CREATININE 1.02* 0.72  GLUCOSE 153* 163*     Assessment/Plan: 1 Day Post-Op Procedure(s) (LRB): Right unicompartmental poly revision (Right) Foley cath d/c'ed Advance diet Up with therapy D/C IV fluids Discharge home Follow up in 2 weeks at [facility] ([place]). Follow up with OLIN,Kaelyn Nauta D in 2 weeks.  Contact information:  [facility]) [address] [place] [phone]    Obese (BMI 30-39.9) Estimated body mass index is 34.5 kg/m as calculated from the following:   Height as of this encounter: 5\' 4"  (1.626 m).   Weight as of this encounter: 91.2 kg. Patient also counseled that weight may inhibit the healing process Patient counseled that losing weight will help with future health issues     West Pugh. Guerino Caporale   PAC  11/08/2017, 8:45 AM

## 2017-11-08 NOTE — Addendum Note (Signed)
Addendum  created 11/08/17 0707 by Lollie Sails, CRNA   Charge Capture section accepted, Visit diagnoses modified

## 2017-11-08 NOTE — Evaluation (Signed)
Physical Therapy Evaluation Patient Details Name: Mariah Torres MRN: [id number] DOB: July 16, 1955 Today's Date: 11/08/2017   History of Present Illness  Pt s/p R UKR revision and with hx of bil UKR and DM  Clinical Impression  Pt s/p R UKR revision and presents with decreased R LE strength/ROM and post op pain limiting functional mobility.  Pt plans dc home this date with OP PT starting 11/10/17.  Home therex plan reviewed with written instruction provided.    Follow Up Recommendations Follow surgeon's recommendation for DC plan and follow-up therapies    Equipment Recommendations  Rolling walker with 5" wheels;3in1 (PT)    Recommendations for Other Services       Precautions / Restrictions Precautions Precautions: Fall;Knee Restrictions Weight Bearing Restrictions: No      Mobility  Bed Mobility Overal bed mobility: Needs Assistance Bed Mobility: Supine to Sit     Supine to sit: Supervision     General bed mobility comments: min cues but no physical assist  Transfers Overall transfer level: Needs assistance Equipment used: Rolling walker (2 wheeled) Transfers: Sit to/from Stand Sit to Stand: Min guard;Supervision         General transfer comment: cues for LE management and use of UEs to self assist  Ambulation/Gait Ambulation/Gait assistance: Min guard;Supervision Gait Distance (Feet): 200 Feet Assistive device: Rolling walker (2 wheeled) Gait Pattern/deviations: Step-to pattern;Step-through pattern;Decreased step length - right;Decreased step length - left;Shuffle;Trunk flexed Gait velocity: decr   General Gait Details: cues for sequence posture and position from RW  Stairs Stairs: Yes Stairs assistance: Min guard Stair Management: Two rails;Step to pattern;Forwards Number of Stairs: 3 General stair comments: pt self cues for sequence  Wheelchair Mobility    Modified Rankin (Stroke Patients Only)       Balance Overall balance assessment: Mild  deficits observed, not formally tested                                           Pertinent Vitals/Pain Pain Assessment: 0-10 Pain Score: 5  Pain Location: R knee Pain Descriptors / Indicators: Aching;Sore Pain Intervention(s): Limited activity within patient's tolerance;Monitored during session;Premedicated before session;Ice applied    Home Living Family/patient expects to be discharged to:: Private residence Living Arrangements: Alone Available Help at Discharge: Family Type of Home: Apartment Home Access: Stairs to enter Entrance Stairs-Rails: Right;Left;Can reach both Technical brewer of Steps: 14 Home Layout: One level Home Equipment: None      Prior Function Level of Independence: Independent               Hand Dominance        Extremity/Trunk Assessment   Upper Extremity Assessment Upper Extremity Assessment: Overall WFL for tasks assessed    Lower Extremity Assessment Lower Extremity Assessment: RLE deficits/detail RLE Deficits / Details: 3/5 quads with AAROM at knee -8 - 80    Cervical / Trunk Assessment Cervical / Trunk Assessment: Normal  Communication   Communication: No difficulties  Cognition Arousal/Alertness: Awake/alert Behavior During Therapy: WFL for tasks assessed/performed Overall Cognitive Status: Within Functional Limits for tasks assessed                                        General Comments      Exercises Total Joint Exercises Ankle Circles/Pumps:  AROM;Both;20 reps;Supine Quad Sets: AROM;Both;10 reps;Supine Heel Slides: AAROM;Right;15 reps;Supine Straight Leg Raises: AAROM;AROM;Right;15 reps;Supine Long Arc Quad: AROM;Right;10 reps;Seated   Assessment/Plan    PT Assessment Patient needs continued PT services  PT Problem List Decreased strength;Decreased range of motion;Decreased activity tolerance;Decreased mobility;Decreased knowledge of use of DME;Pain       PT Treatment  Interventions DME instruction;Gait training;Stair training;Functional mobility training;Therapeutic activities;Therapeutic exercise;Patient/family education    PT Goals (Current goals can be found in the Care Plan section)  Acute Rehab PT Goals Patient Stated Goal: Regain IND  PT Goal Formulation: With patient Time For Goal Achievement: 11/15/17 Potential to Achieve Goals: Good    Frequency 7X/week   Barriers to discharge        Co-evaluation               AM-PAC PT "6 Clicks" Daily Activity  Outcome Measure Difficulty turning over in bed (including adjusting bedclothes, sheets and blankets)?: A Lot Difficulty moving from lying on back to sitting on the side of the bed? : A Lot Difficulty sitting down on and standing up from a chair with arms (e.g., wheelchair, bedside commode, etc,.)?: A Lot Help needed moving to and from a bed to chair (including a wheelchair)?: A Little Help needed walking in hospital room?: A Little Help needed climbing 3-5 steps with a railing? : A Little 6 Click Score: 15    End of Session Equipment Utilized During Treatment: Gait belt Activity Tolerance: Patient tolerated treatment well Patient left: in chair;with call bell/phone within reach Nurse Communication: Mobility status PT Visit Diagnosis: Difficulty in walking, not elsewhere classified (R26.2)    Time: 8338-2505 PT Time Calculation (min) (ACUTE ONLY): 38 min   Charges:   PT Evaluation $PT Eval Low Complexity: 1 Low PT Treatments $Gait Training: 8-22 mins $Therapeutic Exercise: 8-22 mins        Debe Coder PT Acute Rehabilitation Services Pager [phone] Office [phone]   Dontae Minerva 11/08/2017, 12:34 PM

## 2017-11-15 NOTE — Discharge Summary (Signed)
Physician Discharge Summary  Patient ID: BITANIA SHANKLAND MRN: [id number] DOB/AGE: 03/03/1955 62 y.o.  Admit date: 11/07/2017 Discharge date: 11/08/2017   Procedures:  Procedure(s) (LRB): Right unicompartmental poly revision (Right)  Attending Physician:  Dr. Paralee Cancel   Admission Diagnoses:   Right knee pain s/p UKR  Discharge Diagnoses:  Principal Problem:   S/P left UKR poly revision Active Problems:   Obese  Past Medical History:  Diagnosis Date  . Anxiety   . Asthma due to seasonal allergies   . Depression   . Diabetes mellitus without complication ([place])   . High cholesterol   . Hypertension   . Malignant melanoma ([place])   . Obesity   . Optic neuritis, right     HPI:    Mariah Torres, 62 y.o. female, has a history of pain and functional disability in the right knee(s) due to failed previous arthroplasty (poly) and patient has failed non-surgical conservative treatments for greater than 12 weeks to include NSAID's and/or analgesics, use of assistive devices and activity modification. The indications for the revision of the total knee arthroplasty are dislocation of the poly. Onset of symptoms was abrupt starting days ago with stable course since that time.  Prior procedures on the right knee(s) include unicompartmental arthroplasty. Patient currently rates pain in the right knee(s) at 9 out of 10 with activity. There is worsening of pain with activity and weight bearing, pain that interferes with activities of daily living, pain with passive range of motion and joint swelling.  Patient has evidence of poly dislocation by imaging studies. This condition presents safety issues increasing the risk of falls.  There is no current active infection.  Risks, benefits and expectations were discussed with the patient.  Risks including but not limited to the risk of anesthesia, blood clots, nerve damage, blood vessel damage, failure of the prosthesis, infection and up to and  including death.  Patient understand the risks, benefits and expectations and wishes to proceed with surgery.   PCP: System, Provider Not In   Discharged Condition: good  Hospital Course:  Patient underwent the above stated procedure on 11/07/2017. Patient tolerated the procedure well and brought to the recovery room in good condition and subsequently to the floor.  POD #1 BP: 116/77 ; Pulse: 80 ; Temp: 98.2 F (36.8 C) ; Resp: 16 Patient reports pain as mild, pain controlled. No events throughout the night. The procedure and findings were discussed.  Ready to be discharged home.  Dorsiflexion/plantar flexion intact, incision: dressing C/D/I, no cellulitis present and compartment soft.   LABS  Basename    HGB     11.3  HCT     35.0    Discharge Exam: General appearance: alert, cooperative and no distress Extremities: Homans sign is negative, no sign of DVT, no edema, redness or tenderness in the calves or thighs and no ulcers, gangrene or trophic changes  Disposition:  Home with follow up in 2 weeks   Follow-up Information    Paralee Cancel, MD. Schedule an appointment as soon as possible for a visit in 2 weeks.   Specialty:  Orthopedic Surgery Contact information: [address] [place] [address] [phone]           Discharge Instructions    Call MD / Call 911   Complete by:  As directed    If you experience chest pain or shortness of breath, CALL 911 and be transported to the hospital emergency room.  If  you develope a fever above 101 F, pus (white drainage) or increased drainage or redness at the wound, or calf pain, call your surgeon's office.   Change dressing   Complete by:  As directed    Maintain surgical dressing until follow up in the clinic. If the edges start to pull up, may reinforce with tape. If the dressing is no longer working, may remove and cover with gauze and tape, but must keep the area dry and clean.  Call with any questions or  concerns.   Constipation Prevention   Complete by:  As directed    Drink plenty of fluids.  Prune juice may be helpful.  You may use a stool softener, such as Colace (over the counter) 100 mg twice a day.  Use MiraLax (over the counter) for constipation as needed.   Diet - low sodium heart healthy   Complete by:  As directed    Discharge instructions   Complete by:  As directed    Maintain surgical dressing until follow up in the clinic. If the edges start to pull up, may reinforce with tape. If the dressing is no longer working, may remove and cover with gauze and tape, but must keep the area dry and clean.  Follow up in 2 weeks at [facility]. Call with any questions or concerns.   Increase activity slowly as tolerated   Complete by:  As directed    Weight bearing as tolerated with assist device (walker, cane, etc) as directed, use it as long as suggested by your surgeon or therapist, typically at least 4-6 weeks.   TED hose   Complete by:  As directed    Use stockings (TED hose) for 2 weeks on both leg(s).  You may remove them at night for sleeping.      Allergies as of 11/08/2017      Reactions   Aleve [naproxen Sodium] Anaphylaxis, Other (See Comments)   Patient reports she tolerates ibuprofen.   Codeine Itching, Other (See Comments)   jittery   Penicillins Other (See Comments)   Has patient had a PCN reaction causing immediate rash, facial/tongue/throat swelling, SOB or lightheadedness with hypotension: No Has patient had a PCN reaction causing severe rash involving mucus membranes or skin necrosis: No Has patient had a PCN reaction that required hospitalization No Has patient had a PCN reaction occurring within the last 10 years: No If all of the above answers are "NO", then may proceed with Cephalosporin use.   Ace Inhibitors Cough      Medication List    STOP taking these medications   aspirin EC 81 MG tablet Replaced by:  aspirin 81 MG chewable tablet     doxepin 10 MG capsule Commonly known as:  SINEQUAN     TAKE these medications   ALPRAZolam 0.5 MG tablet Commonly known as:  XANAX Take one tablet 30 min. piror to MRI.  May repeat once if needed.  May not drink alcohol, drive or operate dangerous equipment while taking.   aspirin 81 MG chewable tablet Chew 1 tablet (81 mg total) by mouth 2 (two) times daily. Take for 4 weeks, then resume regular dose. Replaces:  aspirin EC 81 MG tablet   atorvastatin 80 MG tablet Commonly known as:  LIPITOR Take 80 mg by mouth daily.   BYDUREON 2 MG Pen Generic drug:  Exenatide ER Inject 2 mg into the skin every Sunday.   docusate sodium 100 MG capsule Commonly known as:  COLACE  Take 1 capsule (100 mg total) by mouth 2 (two) times daily.   escitalopram 20 MG tablet Commonly known as:  LEXAPRO Take 20 mg by mouth daily.   ferrous sulfate 325 (65 FE) MG tablet Take 1 tablet (325 mg total) by mouth 3 (three) times daily with meals.   HYDROcodone-acetaminophen 7.5-325 MG tablet Commonly known as:  NORCO Take 1-2 tablets by mouth every 4 (four) hours as needed for moderate pain.   JARDIANCE 25 MG Tabs tablet Generic drug:  empagliflozin Take 25 mg by mouth daily.   losartan-hydrochlorothiazide 100-25 MG tablet Commonly known as:  HYZAAR Take 1 tablet by mouth daily.   metFORMIN 1000 MG tablet Commonly known as:  GLUCOPHAGE Take 1,000 mg by mouth 2 (two) times daily with a meal.   methocarbamol 500 MG tablet Commonly known as:  ROBAXIN Take 1 tablet (500 mg total) by mouth every 6 (six) hours as needed for muscle spasms.   pantoprazole 40 MG tablet Commonly known as:  PROTONIX Take 1 tablet (40 mg total) by mouth daily.   polyethylene glycol packet Commonly known as:  MIRALAX / GLYCOLAX Take 17 g by mouth 2 (two) times daily.   Vitamin D3 125 MCG (5000 UT) Caps Take 5,000 Units by mouth daily.            Discharge Care Instructions  (From admission, onward)          Start     Ordered   11/08/17 0000  Change dressing    Comments:  Maintain surgical dressing until follow up in the clinic. If the edges start to pull up, may reinforce with tape. If the dressing is no longer working, may remove and cover with gauze and tape, but must keep the area dry and clean.  Call with any questions or concerns.   11/08/17 8921           Signed: [place] Pugh. Estephani Popper   PA-C  11/15/2017, 12:55 PM

## 2018-10-26 ENCOUNTER — Other Ambulatory Visit: Payer: Self-pay | Admitting: Physician Assistant

## 2018-10-26 DIAGNOSIS — Z1231 Encounter for screening mammogram for malignant neoplasm of breast: Secondary | ICD-10-CM

## 2018-10-29 ENCOUNTER — Ambulatory Visit
Admission: RE | Admit: 2018-10-29 | Discharge: 2018-10-29 | Disposition: A | Discharge status: Home / Self Care | Payer: BC Managed Care – PPO | Source: Ambulatory Visit | Attending: Physician Assistant | Admitting: Physician Assistant

## 2018-10-29 ENCOUNTER — Other Ambulatory Visit: Payer: Self-pay

## 2018-10-29 DIAGNOSIS — Z1231 Encounter for screening mammogram for malignant neoplasm of breast: Secondary | ICD-10-CM

## 2019-01-01 ENCOUNTER — Ambulatory Visit: Payer: BC Managed Care – PPO | Attending: Internal Medicine

## 2019-01-01 DIAGNOSIS — Z20822 Contact with and (suspected) exposure to covid-19: Secondary | ICD-10-CM

## 2019-01-03 LAB — NOVEL CORONAVIRUS, NAA: SARS-CoV-2, NAA: NOT DETECTED

## 2020-02-05 NOTE — Progress Notes (Signed)
Office Visit Note  Patient: Mariah Torres             Date of Birth: [birth year]           MRN: [id number]             PCP: Juanda Chance Referring: Juanda Chance Visit Date: 02/19/2020 Occupation: @GUAROCC @  Subjective:  Pain in hands and feet.   History of Present Illness: Mariah Torres is a 65 y.o. female seen in consultation per request of her PCP.  According to the patient she has had arthritis in her knee joints for over 20 years.  She underwent bilateral knee replacements and then revision of her right knee joint later on.  She has had discomfort in her feet for over the years.  She also believes that she might have had plantar fasciitis in the past.  She has been experiencing increased discomfort in her hands over the last 8 months.  She states she develops swelling in her hands and then it goes down.  Currently she is not having much swelling.  She has some stiffness in her hands.  She denies any discomfort in any other joints.  She states she was diagnosed with optic neuritis and has been under care of a neurologist.  There is family history of multiple sclerosis in her mother and her twin sister.  She is also noticed a rash on her elbow and she has had rash in her ear canals and her scalp which is very itchy.  She has never seen a dermatologist.  Activities of Daily Living:  Patient reports morning stiffness for 15-20 minutes.   Patient Denies nocturnal pain.  Difficulty dressing/grooming: Denies Difficulty climbing stairs: Denies Difficulty getting out of chair: Denies Difficulty using hands for taps, buttons, cutlery, and/or writing: Reports  Review of Systems  Constitutional: Positive for fatigue. Negative for night sweats, weight gain and weight loss.  HENT: Negative for mouth sores, trouble swallowing, trouble swallowing, mouth dryness and nose dryness.   Eyes: Positive for dryness. Negative for pain, redness, itching and visual disturbance.   Respiratory: Negative for cough, shortness of breath and difficulty breathing.   Cardiovascular: Negative for chest pain, palpitations, hypertension, irregular heartbeat and swelling in legs/feet.  Gastrointestinal: Negative for blood in stool, constipation and diarrhea.  Endocrine: Negative for increased urination.  Genitourinary: Negative for difficulty urinating and vaginal dryness.  Musculoskeletal: Positive for arthralgias, joint pain, joint swelling and morning stiffness. Negative for myalgias, muscle weakness, muscle tenderness and myalgias.  Skin: Positive for rash. Negative for color change, hair loss, redness, skin tightness, ulcers and sensitivity to sunlight.  Allergic/Immunologic: Negative for susceptible to infections.  Neurological: Negative for dizziness, numbness, headaches, memory loss, night sweats and weakness.  Hematological: Positive for bruising/bleeding tendency. Negative for swollen glands.  Psychiatric/Behavioral: Positive for sleep disturbance. Negative for depressed mood and confusion. The patient is nervous/anxious.     PMFS History:  Patient Active Problem List   Diagnosis Date Noted  . Obese 11/08/2017  . S/P left UKR poly revision 11/07/2017  . Acute thoracic back pain   . Chest pain 12/10/2015  . Hypokalemia 12/10/2015  . Hypomagnesemia 12/10/2015  . Asthma due to seasonal allergies   . Anxiety   . Family history of multiple sclerosis 08/14/2015  . Benign paroxysmal positional vertigo 03/06/2015  . Other headache syndrome 03/06/2015  . Neck pain 03/06/2015  . Abnormal finding on MRI of brain 08/20/2014  . Anxiety state 08/20/2014  .  Abnormal findings-skull or head 08/20/2014  . Optic neuritis 08/16/2014  . HTN (hypertension) 08/16/2014  . T2DM (type 2 diabetes mellitus) ([place]) 08/16/2014  . HLD (hyperlipidemia) 08/16/2014  . Eye pain   . Abnormal LFTs 04/30/2011  . Diabetes mellitus type 2, uncontrolled ([place]) 04/29/2011  . BP (high blood  pressure) 04/29/2011  . Cannot sleep 04/29/2011  . 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 Diabetes mellitus, type 2 ([place]) 11/03/2008  . Type 2 diabetes mellitus ([place]) 11/03/2008  . 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  . Arthritis of knee, degenerative 06/03/2008  . Abnormal immunological findings in specimens from other organs, systems and tissues 06/03/2008    Past Medical History:  Diagnosis Date  . Anxiety   . Depression   . Diabetes mellitus without complication ([place])   . High cholesterol   . Hypertension   . Malignant melanoma ([place])   . Obesity   . Optic neuritis, right     Family History  Problem Relation Age of Onset  . Multiple sclerosis Mother   . Cirrhosis Father   . Multiple sclerosis Sister   . Stroke Sister   . Healthy Brother   . Healthy Daughter   . Healthy Daughter    Past Surgical History:  Procedure Laterality Date  . ABDOMINAL HYSTERECTOMY    . I & D KNEE WITH POLY EXCHANGE Right 11/07/2017   Procedure: Right unicompartmental poly revision;  Surgeon: Paralee Cancel, MD;  Location: [facility];  Service: Orthopedics;  Laterality: Right;  90 mins  . JOINT REPLACEMENT Bilateral    knee   Social History   Social History Narrative  . Not on file   Immunization History  Administered Date(s) Administered  . Moderna Sars-Covid-2 Vaccination 03/31/2019, 04/28/2019, 11/06/2019     Objective: Vital Signs: BP 111/76 (BP Location: Right Arm, Patient Position: Sitting, Cuff Size: Normal)   Pulse 94   Resp 15   Ht 5' 3.5" (1.613 m)   Wt 194 lb 12.8 oz (88.4 kg)   BMI 33.97 kg/m    Physical Exam Vitals and nursing note reviewed.  Constitutional:      Appearance: She is well-developed and well-nourished.  HENT:     Head: Normocephalic and atraumatic.  Eyes:     Extraocular Movements: EOM normal.     Conjunctiva/sclera: Conjunctivae normal.  Cardiovascular:     Rate and Rhythm: Normal rate and regular rhythm.     Pulses: Intact distal pulses.     Heart  sounds: Normal heart sounds.  Pulmonary:     Effort: Pulmonary effort is normal.     Breath sounds: Normal breath sounds.  Abdominal:     General: Bowel sounds are normal.     Palpations: Abdomen is soft.  Musculoskeletal:     Cervical back: Normal range of motion.  Lymphadenopathy:     Cervical: No cervical adenopathy.  Skin:    General: Skin is warm and dry.     Capillary Refill: Capillary refill takes less than 2 seconds.     Findings: Rash present.     Comments: Erythematous pearly plaque was noted over right elbow.  Erythematous plaques were noted over the occipital region of her scalp.  Neurological:     Mental Status: She is alert and oriented to person, place, and time.  Psychiatric:        Mood and Affect: Mood and affect normal.        Behavior: Behavior normal.      Musculoskeletal Exam: C-spine thoracic and lumbar spine  were in good range of motion.  She had no SI joint tenderness.  Shoulder joints, elbow joints, wrist joints, MCPs with good range of motion.  She has bilateral CMC subluxation.  She has bilateral DIP thickening.  No synovitis was noted.  No nail pitting was noted.  Hip joints with good range of motion.  Bilateral knee joints are replaced without any warmth swelling or effusion.  She had no tenderness over MTPs PIPs or DIPs.  No synovitis or synovial thickening was noted.  Bilateral pes planus was noted.  CDAI Exam: CDAI Score: - Patient Global: -; Provider Global: - Swollen: -; Tender: - Joint Exam 02/19/2020   No joint exam has been documented for this visit   There is currently no information documented on the homunculus. Go to the Rheumatology activity and complete the homunculus joint exam.  Investigation: No additional findings.  Imaging: No results found.  Recent Labs: Lab Results  Component Value Date   WBC 7.4 11/08/2017   HGB 11.3 (L) 11/08/2017   PLT 213 11/08/2017   NA 139 11/08/2017   K 3.2 (L) 11/08/2017   CL 105 11/08/2017    CO2 28 11/08/2017   GLUCOSE 163 (H) 11/08/2017   BUN 19 11/08/2017   CREATININE 0.72 11/08/2017   BILITOT 0.9 11/06/2017   ALKPHOS 72 11/06/2017   AST 81 (H) 11/06/2017   ALT 68 (H) 11/06/2017   PROT 7.9 11/06/2017   ALBUMIN 4.5 11/06/2017   CALCIUM 9.1 11/08/2017   GFRAA >60 11/08/2017    Speciality Comments: No specialty comments available.  Procedures:  No procedures performed Allergies: Aleve [naproxen sodium], Penicillins, and Ace inhibitors   Assessment / Plan:     Visit Diagnoses: Primary osteoarthritis of both hands - Bilateral hand XRs 08/29/19: Severe arthropathy of 1st CMC, osteoporosis of IP joints with component of erosive OA involving 2nd DIP-read by radiologist.  These findings are consistent with osteoarthritis.  The clinical findings are consistent with osteoarthritis.  I did not see any inflammatory component.  There was no nail pitting.  Joint protection muscle strengthening was discussed.  I advised her to contact me in case she develops increased swelling.  Status post total knee replacement, bilateral - At [facility].  Patient had revision of her right knee placement.  She is doing well without much discomfort.  Pes planus of both feet-she complains of some midfoot pain which I believe is related to pes planus. Feet exercises were discussed.  Orthotics with arch support were discussed.  Pain in both feet-she had no synovitis on my examination.  Rash-she had erythematous pearly patch on her right elbow and also over the occipital region of her scalp which appears to be consistent with psoriasis.  I have advised her to make an appointment with a dermatologist.  I do not see a component of psoriatic arthritis on my examination today.  Optic neuritis - followed by Dr. Felecia Shelling  Family history of multiple sclerosis - mother and twin sister  Other medical problems are listed as follows:  Type 2 diabetes mellitus without complication, without long-term current use of  insulin ([place])  Stage 3a chronic kidney disease ([place])  Primary hypertension  History of hyperlipidemia  Diverticular disease of colon  Asthma due to seasonal allergies  HCV antibody positive  History of gastroesophageal reflux (GERD)  Dermatitis seborrheica  History of anxiety  Orders: No orders of the defined types were placed in this encounter.  No orders of the defined types were placed in this  encounter.  Follow-Up Instructions: Return in about 1 year (around 02/18/2021) for Osteoarthritis.   Bo Merino, MD  Note - This record has been created using Editor, commissioning.  Chart creation errors have been sought, but may not always  have been located. Such creation errors do not reflect on  the standard of medical care.

## 2020-02-19 ENCOUNTER — Other Ambulatory Visit: Payer: Self-pay

## 2020-02-19 ENCOUNTER — Encounter: Payer: Self-pay | Admitting: Rheumatology

## 2020-02-19 ENCOUNTER — Ambulatory Visit: Payer: PRIVATE HEALTH INSURANCE | Admitting: Rheumatology

## 2020-02-19 VITALS — BP 111/76 | HR 94 | Resp 15 | Ht 63.5 in | Wt 194.8 lb

## 2020-02-19 DIAGNOSIS — M19041 Primary osteoarthritis, right hand: Secondary | ICD-10-CM

## 2020-02-19 DIAGNOSIS — Z820 Family history of epilepsy and other diseases of the nervous system: Secondary | ICD-10-CM

## 2020-02-19 DIAGNOSIS — E119 Type 2 diabetes mellitus without complications: Secondary | ICD-10-CM

## 2020-02-19 DIAGNOSIS — R21 Rash and other nonspecific skin eruption: Secondary | ICD-10-CM

## 2020-02-19 DIAGNOSIS — R7689 Other specified abnormal immunological findings in serum: Secondary | ICD-10-CM

## 2020-02-19 DIAGNOSIS — M2141 Flat foot [pes planus] (acquired), right foot: Secondary | ICD-10-CM

## 2020-02-19 DIAGNOSIS — Z8659 Personal history of other mental and behavioral disorders: Secondary | ICD-10-CM

## 2020-02-19 DIAGNOSIS — M79671 Pain in right foot: Secondary | ICD-10-CM

## 2020-02-19 DIAGNOSIS — J45909 Unspecified asthma, uncomplicated: Secondary | ICD-10-CM

## 2020-02-19 DIAGNOSIS — K5730 Diverticulosis of large intestine without perforation or abscess without bleeding: Secondary | ICD-10-CM

## 2020-02-19 DIAGNOSIS — H469 Unspecified optic neuritis: Secondary | ICD-10-CM

## 2020-02-19 DIAGNOSIS — L219 Seborrheic dermatitis, unspecified: Secondary | ICD-10-CM

## 2020-02-19 DIAGNOSIS — Z96652 Presence of left artificial knee joint: Secondary | ICD-10-CM

## 2020-02-19 DIAGNOSIS — Z8639 Personal history of other endocrine, nutritional and metabolic disease: Secondary | ICD-10-CM

## 2020-02-19 DIAGNOSIS — Z96653 Presence of artificial knee joint, bilateral: Secondary | ICD-10-CM

## 2020-02-19 DIAGNOSIS — M19042 Primary osteoarthritis, left hand: Secondary | ICD-10-CM

## 2020-02-19 DIAGNOSIS — Z8719 Personal history of other diseases of the digestive system: Secondary | ICD-10-CM

## 2020-02-19 DIAGNOSIS — R768 Other specified abnormal immunological findings in serum: Secondary | ICD-10-CM

## 2020-02-19 DIAGNOSIS — M79672 Pain in left foot: Secondary | ICD-10-CM

## 2020-02-19 DIAGNOSIS — M2142 Flat foot [pes planus] (acquired), left foot: Secondary | ICD-10-CM

## 2020-02-19 DIAGNOSIS — I10 Essential (primary) hypertension: Secondary | ICD-10-CM

## 2020-02-19 DIAGNOSIS — N1831 Chronic kidney disease, stage 3a: Secondary | ICD-10-CM

## 2020-02-19 NOTE — Patient Instructions (Signed)
Hand Exercises Hand exercises can be helpful for almost anyone. These exercises can strengthen the hands, improve flexibility and movement, and increase blood flow to the hands. These results can make work and daily tasks easier. Hand exercises can be especially helpful for people who have joint pain from arthritis or have nerve damage from overuse (carpal tunnel syndrome). These exercises can also help people who have injured a hand. Exercises Most of these hand exercises are gentle stretching and motion exercises. It is usually safe to do them often throughout the day. Warming up your hands before exercise may help to reduce stiffness. You can do this with gentle massage or by placing your hands in warm water for 10-15 minutes. It is normal to feel some stretching, pulling, tightness, or mild discomfort as you begin new exercises. This will gradually improve. Stop an exercise right away if you feel sudden, severe pain or your pain gets worse. Ask your health care provider which exercises are best for you. Knuckle bend or "claw" fist 1. Stand or sit with your arm, hand, and all five fingers pointed straight up. Make sure to keep your wrist straight during the exercise. 2. Gently bend your fingers down toward your palm until the tips of your fingers are touching the top of your palm. Keep your big knuckle straight and just bend the small knuckles in your fingers. 3. Hold this position for __________ seconds. 4. Straighten (extend) your fingers back to the starting position. Repeat this exercise 5-10 times with each hand. Full finger fist 1. Stand or sit with your arm, hand, and all five fingers pointed straight up. Make sure to keep your wrist straight during the exercise. 2. Gently bend your fingers into your palm until the tips of your fingers are touching the middle of your palm. 3. Hold this position for __________ seconds. 4. Extend your fingers back to the starting position, stretching every  joint fully. Repeat this exercise 5-10 times with each hand. Straight fist 1. Stand or sit with your arm, hand, and all five fingers pointed straight up. Make sure to keep your wrist straight during the exercise. 2. Gently bend your fingers at the big knuckle, where your fingers meet your hand, and the middle knuckle. Keep the knuckle at the tips of your fingers straight and try to touch the bottom of your palm. 3. Hold this position for __________ seconds. 4. Extend your fingers back to the starting position, stretching every joint fully. Repeat this exercise 5-10 times with each hand. Tabletop 1. Stand or sit with your arm, hand, and all five fingers pointed straight up. Make sure to keep your wrist straight during the exercise. 2. Gently bend your fingers at the big knuckle, where your fingers meet your hand, as far down as you can while keeping the small knuckles in your fingers straight. Think of forming a tabletop with your fingers. 3. Hold this position for __________ seconds. 4. Extend your fingers back to the starting position, stretching every joint fully. Repeat this exercise 5-10 times with each hand. Finger spread 1. Place your hand flat on a table with your palm facing down. Make sure your wrist stays straight as you do this exercise. 2. Spread your fingers and thumb apart from each other as far as you can until you feel a gentle stretch. Hold this position for __________ seconds. 3. Bring your fingers and thumb tight together again. Hold this position for __________ seconds. Repeat this exercise 5-10 times with each hand.   Making circles 1. Stand or sit with your arm, hand, and all five fingers pointed straight up. Make sure to keep your wrist straight during the exercise. 2. Make a circle by touching the tip of your thumb to the tip of your index finger. 3. Hold for __________ seconds. Then open your hand wide. 4. Repeat this motion with your thumb and each finger on your  hand. Repeat this exercise 5-10 times with each hand. Thumb motion 1. Sit with your forearm resting on a table and your wrist straight. Your thumb should be facing up toward the ceiling. Keep your fingers relaxed as you move your thumb. 2. Lift your thumb up as high as you can toward the ceiling. Hold for __________ seconds. 3. Bend your thumb across your palm as far as you can, reaching the tip of your thumb for the small finger (pinkie) side of your palm. Hold for __________ seconds. Repeat this exercise 5-10 times with each hand. Grip strengthening 1. Hold a stress ball or other soft ball in the middle of your hand. 2. Slowly increase the pressure, squeezing the ball as much as you can without causing pain. Think of bringing the tips of your fingers into the middle of your palm. All of your finger joints should bend when doing this exercise. 3. Hold your squeeze for __________ seconds, then relax. Repeat this exercise 5-10 times with each hand.   Contact a health care provider if:  Your hand pain or discomfort gets much worse when you do an exercise.  Your hand pain or discomfort does not improve within 2 hours after you exercise. If you have any of these problems, stop doing these exercises right away. Do not do them again unless your health care provider says that you can. Get help right away if:  You develop sudden, severe hand pain or swelling. If this happens, stop doing these exercises right away. Do not do them again unless your health care provider says that you can. This information is not intended to replace advice given to you by your health care provider. Make sure you discuss any questions you have with your health care provider. Document Revised: 04/19/2018 Document Reviewed: 12/28/2017 Elsevier Patient Education  2021 [employer]  

## 2020-03-12 ENCOUNTER — Ambulatory Visit: Payer: BC Managed Care – PPO | Admitting: Rheumatology

## 2020-08-20 DIAGNOSIS — E1165 Type 2 diabetes mellitus with hyperglycemia: Secondary | ICD-10-CM | POA: Diagnosis not present

## 2020-08-20 DIAGNOSIS — E1159 Type 2 diabetes mellitus with other circulatory complications: Secondary | ICD-10-CM | POA: Diagnosis not present

## 2020-08-20 DIAGNOSIS — F330 Major depressive disorder, recurrent, mild: Secondary | ICD-10-CM | POA: Diagnosis not present

## 2020-08-20 DIAGNOSIS — I152 Hypertension secondary to endocrine disorders: Secondary | ICD-10-CM | POA: Diagnosis not present

## 2021-02-07 NOTE — Progress Notes (Deleted)
Office Visit Note  Patient: Mariah Torres             Date of Birth: 1955/12/15           MRN: [id number]             PCP: Juanda Chance Referring: Juanda Chance Visit Date: 02/17/2021 Occupation: @GUAROCC @  Subjective:  No chief complaint on file.   History of Present Illness: Mariah Torres is a 66 y.o. female ***   Activities of Daily Living:  Patient reports morning stiffness for *** {minute/hour:19697}.   Patient {ACTIONS;DENIES/REPORTS:21021675::"Denies"} nocturnal pain.  Difficulty dressing/grooming: {ACTIONS;DENIES/REPORTS:21021675::"Denies"} Difficulty climbing stairs: {ACTIONS;DENIES/REPORTS:21021675::"Denies"} Difficulty getting out of chair: {ACTIONS;DENIES/REPORTS:21021675::"Denies"} Difficulty using hands for taps, buttons, cutlery, and/or writing: {ACTIONS;DENIES/REPORTS:21021675::"Denies"}  No Rheumatology ROS completed.   PMFS History:  Patient Active Problem List   Diagnosis Date Noted   Obese 11/08/2017   S/P left UKR poly revision 11/07/2017   Acute thoracic back pain    Chest pain 12/10/2015   Hypokalemia 12/10/2015   Hypomagnesemia 12/10/2015   Asthma due to seasonal allergies    Anxiety    Family history of multiple sclerosis 08/14/2015   Benign paroxysmal positional vertigo 03/06/2015   Other headache syndrome 03/06/2015   Neck pain 03/06/2015   Abnormal finding on MRI of brain 08/20/2014   Anxiety state 08/20/2014   Abnormal findings-skull or head 08/20/2014   Optic neuritis 08/16/2014   HTN (hypertension) 08/16/2014   T2DM (type 2 diabetes mellitus) ([place]) 08/16/2014   HLD (hyperlipidemia) 08/16/2014   Eye pain    Abnormal LFTs 04/30/2011   Diabetes mellitus type 2, uncontrolled 04/29/2011   BP (high blood pressure) 04/29/2011   Cannot sleep 04/29/2011   Dermatitis seborrheica 12/18/2008   Diabetes mellitus, type 2 ([place]) 11/03/2008   Type 2 diabetes mellitus ([place]) 11/03/2008   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    Arthritis of knee, degenerative 06/03/2008   Abnormal immunological findings in specimens from other organs, systems and tissues 06/03/2008    Past Medical History:  Diagnosis Date   Anxiety    Depression    Diabetes mellitus without complication (HCC)    High cholesterol    Hypertension    Malignant melanoma ([place])    Obesity    Optic neuritis, right     Family History  Problem Relation Age of Onset   Multiple sclerosis Mother    Cirrhosis Father    Multiple sclerosis Sister    Stroke Sister    Healthy Brother    Healthy Daughter    Healthy Daughter    Past Surgical History:  Procedure Laterality Date   ABDOMINAL HYSTERECTOMY     I & D KNEE WITH POLY EXCHANGE Right 11/07/2017   Procedure: Right unicompartmental poly revision;  Surgeon: Paralee Cancel, MD;  Location: [facility];  Service: Orthopedics;  Laterality: Right;  90 mins   JOINT REPLACEMENT Bilateral    knee   Social History   Social History Narrative   Not on file   Immunization History  Administered Date(s) Administered   Moderna Sars-Covid-2 Vaccination 03/31/2019, 04/28/2019, 11/06/2019     Objective: Vital Signs: There were no vitals taken for this visit.   Physical Exam   Musculoskeletal Exam: ***  CDAI Exam: CDAI Score: -- Patient Global: --; Provider Global: -- Swollen: --; Tender: -- Joint Exam 02/17/2021   No joint exam has been documented for this visit   There is currently no information documented on the homunculus. Go to the  Rheumatology activity and complete the homunculus joint exam.  Investigation: No additional findings.  Imaging: No results found.  Recent Labs: Lab Results  Component Value Date   WBC 7.4 11/08/2017   HGB 11.3 (L) 11/08/2017   PLT 213 11/08/2017   NA 139 11/08/2017   K 3.2 (L) 11/08/2017   CL 105 11/08/2017   CO2 28 11/08/2017   GLUCOSE 163 (H) 11/08/2017   BUN 19 11/08/2017   CREATININE 0.72 11/08/2017   BILITOT 0.9 11/06/2017   ALKPHOS 72 11/06/2017    AST 81 (H) 11/06/2017   ALT 68 (H) 11/06/2017   PROT 7.9 11/06/2017   ALBUMIN 4.5 11/06/2017   CALCIUM 9.1 11/08/2017   GFRAA >60 11/08/2017    Speciality Comments: No specialty comments available.  Procedures:  No procedures performed Allergies: Aleve [naproxen sodium], Penicillins, and Ace inhibitors   Assessment / Plan:     Visit Diagnoses: No diagnosis found.  Orders: No orders of the defined types were placed in this encounter.  No orders of the defined types were placed in this encounter.   Face-to-face time spent with patient was *** minutes. Greater than 50% of time was spent in counseling and coordination of care.  Follow-Up Instructions: No follow-ups on file.   Bo Merino, MD  Note - This record has been created using Editor, commissioning.  Chart creation errors have been sought, but may not always  have been located. Such creation errors do not reflect on  the standard of medical care.

## 2021-02-17 ENCOUNTER — Ambulatory Visit: Payer: Self-pay | Admitting: Rheumatology

## 2022-04-01 ENCOUNTER — Other Ambulatory Visit (HOSPITAL_BASED_OUTPATIENT_CLINIC_OR_DEPARTMENT_OTHER): Payer: Self-pay

## 2022-04-01 MED ORDER — MOUNJARO 10 MG/0.5ML ~~LOC~~ SOAJ
SUBCUTANEOUS | 0 refills | Status: AC
  Filled 2022-04-01: qty 2, 28d supply, fill #0

## 2022-04-02 ENCOUNTER — Other Ambulatory Visit (HOSPITAL_BASED_OUTPATIENT_CLINIC_OR_DEPARTMENT_OTHER): Payer: Self-pay

## 2022-04-04 ENCOUNTER — Other Ambulatory Visit (HOSPITAL_BASED_OUTPATIENT_CLINIC_OR_DEPARTMENT_OTHER): Payer: Self-pay

## 2023-05-02 ENCOUNTER — Other Ambulatory Visit: Payer: Self-pay
